# Patient Record
Sex: Male | Born: 1965 | Race: White | Hispanic: No | Smoking: Current every day smoker
Health system: Southern US, Community
[De-identification: ages and names within clinical notes are randomized; demographics above are authoritative.]

## PROBLEM LIST (undated history)

## (undated) DIAGNOSIS — S2220XA Unspecified fracture of sternum, initial encounter for closed fracture: Secondary | ICD-10-CM

## (undated) DIAGNOSIS — K047 Periapical abscess without sinus: Secondary | ICD-10-CM

## (undated) HISTORY — PX: HERNIA REPAIR: SHX51

## (undated) HISTORY — PX: EYE SURGERY: SHX253

---

## 2000-05-31 ENCOUNTER — Emergency Department (HOSPITAL_COMMUNITY): Admission: EM | Admit: 2000-05-31 | Discharge: 2000-05-31 | Payer: Self-pay | Admitting: Emergency Medicine

## 2000-05-31 ENCOUNTER — Encounter: Payer: Self-pay | Admitting: Emergency Medicine

## 2000-06-01 ENCOUNTER — Other Ambulatory Visit (HOSPITAL_COMMUNITY): Admission: RE | Admit: 2000-06-01 | Discharge: 2000-06-08 | Payer: Self-pay | Admitting: Psychiatry

## 2005-07-30 ENCOUNTER — Emergency Department (HOSPITAL_COMMUNITY): Admission: EM | Admit: 2005-07-30 | Discharge: 2005-07-30 | Payer: Self-pay | Admitting: Emergency Medicine

## 2005-07-31 ENCOUNTER — Ambulatory Visit: Payer: Self-pay | Admitting: Nurse Practitioner

## 2005-10-18 ENCOUNTER — Emergency Department (HOSPITAL_COMMUNITY): Admission: EM | Admit: 2005-10-18 | Discharge: 2005-10-18 | Payer: Self-pay | Admitting: Emergency Medicine

## 2005-10-22 ENCOUNTER — Ambulatory Visit: Payer: Self-pay | Admitting: Internal Medicine

## 2005-11-17 ENCOUNTER — Ambulatory Visit: Payer: Self-pay | Admitting: Nurse Practitioner

## 2006-09-14 ENCOUNTER — Emergency Department (HOSPITAL_COMMUNITY): Admission: EM | Admit: 2006-09-14 | Discharge: 2006-09-14 | Payer: Self-pay | Admitting: *Deleted

## 2007-06-29 ENCOUNTER — Emergency Department (HOSPITAL_COMMUNITY): Admission: EM | Admit: 2007-06-29 | Discharge: 2007-06-29 | Payer: Self-pay | Admitting: Emergency Medicine

## 2011-01-24 LAB — DIFFERENTIAL
Basophils Absolute: 0.1
Eosinophils Absolute: 0.3
Lymphocytes Relative: 23
Lymphs Abs: 1.5
Neutro Abs: 4.4

## 2011-01-24 LAB — CBC
Hemoglobin: 14.6
MCV: 96.8
Platelets: 316
RBC: 4.3
RDW: 13.7
WBC: 6.7

## 2011-01-24 LAB — I-STAT 8, (EC8 V) (CONVERTED LAB)
Acid-base deficit: 1
Chloride: 107
Glucose, Bld: 102 — ABNORMAL HIGH
Hemoglobin: 16
Sodium: 141
pCO2, Ven: 39 — ABNORMAL LOW

## 2011-01-24 LAB — D-DIMER, QUANTITATIVE: D-Dimer, Quant: <0.22

## 2011-01-24 LAB — POCT CARDIAC MARKERS: Myoglobin, poc: 38.2

## 2011-08-21 ENCOUNTER — Other Ambulatory Visit: Payer: Self-pay

## 2011-08-21 ENCOUNTER — Encounter (HOSPITAL_COMMUNITY): Payer: Self-pay | Admitting: Emergency Medicine

## 2011-08-21 ENCOUNTER — Emergency Department (HOSPITAL_COMMUNITY)
Admission: EM | Admit: 2011-08-21 | Discharge: 2011-08-21 | Disposition: A | Discharge status: Home / Self Care | Payer: Self-pay | Attending: Emergency Medicine | Admitting: Emergency Medicine

## 2011-08-21 DIAGNOSIS — F17200 Nicotine dependence, unspecified, uncomplicated: Secondary | ICD-10-CM | POA: Insufficient documentation

## 2011-08-21 DIAGNOSIS — R109 Unspecified abdominal pain: Secondary | ICD-10-CM | POA: Insufficient documentation

## 2011-08-21 LAB — CBC
HCT: 43 % (ref 39.0–52.0)
Hemoglobin: 14.9 g/dL (ref 13.0–17.0)
MCH: 32.6 pg (ref 26.0–34.0)
MCHC: 34.7 g/dL (ref 30.0–36.0)
MCV: 94.1 fL (ref 78.0–100.0)
Platelets: 347 10*3/uL (ref 150–400)
RBC: 4.57 MIL/uL (ref 4.22–5.81)
RDW: 12.6 % (ref 11.5–15.5)
WBC: 9 10*3/uL (ref 4.0–10.5)

## 2011-08-21 LAB — COMPREHENSIVE METABOLIC PANEL
ALT: 13 U/L (ref 0–53)
AST: 14 U/L (ref 0–37)
Albumin: 3.8 g/dL (ref 3.5–5.2)
Alkaline Phosphatase: 66 U/L (ref 39–117)
BUN: 7 mg/dL (ref 6–23)
CO2: 29 mEq/L (ref 19–32)
Calcium: 9.4 mg/dL (ref 8.4–10.5)
Chloride: 102 mEq/L (ref 96–112)
Creatinine, Ser: 0.71 mg/dL (ref 0.50–1.35)
GFR calc Af Amer: >90 mL/min (ref >90)
GFR calc non Af Amer: >90 mL/min (ref >90)
Glucose, Bld: 104 mg/dL — ABNORMAL HIGH (ref 70–99)
Potassium: 3.9 mEq/L (ref 3.5–5.1)
Sodium: 140 mEq/L (ref 135–145)
Total Bilirubin: 0.3 mg/dL (ref 0.3–1.2)
Total Protein: 6.9 g/dL (ref 6.0–8.3)

## 2011-08-21 LAB — URINALYSIS, ROUTINE W REFLEX MICROSCOPIC
Bilirubin Urine: NEGATIVE
Glucose, UA: NEGATIVE mg/dL
Hgb urine dipstick: NEGATIVE
Ketones, ur: NEGATIVE mg/dL
Leukocytes, UA: NEGATIVE
Nitrite: NEGATIVE
Protein, ur: NEGATIVE mg/dL
Specific Gravity, Urine: 1.01 (ref 1.005–1.030)
Urobilinogen, UA: 0.2 mg/dL (ref 0.0–1.0)
pH: 6.5 (ref 5.0–8.0)

## 2011-08-21 LAB — LIPASE, BLOOD: Lipase: 18 U/L (ref 11–59)

## 2011-08-21 MED ORDER — MORPHINE SULFATE 4 MG/ML IJ SOLN
4.0000 mg | Freq: Once | INTRAMUSCULAR | Status: AC
  Administered 2011-08-21: 4 mg via INTRAVENOUS
  Filled 2011-08-21: qty 1

## 2011-08-21 MED ORDER — GI COCKTAIL ~~LOC~~
10.0000 mL | Freq: Once | ORAL | Status: AC
  Administered 2011-08-21: 10 mL via ORAL
  Filled 2011-08-21: qty 30

## 2011-08-21 MED ORDER — SODIUM CHLORIDE 0.9 % IV BOLUS (SEPSIS)
1000.0000 mL | Freq: Once | INTRAVENOUS | Status: AC
  Administered 2011-08-21: 1000 mL via INTRAVENOUS

## 2011-08-21 MED ORDER — FAMOTIDINE IN NACL 20-0.9 MG/50ML-% IV SOLN
20.0000 mg | Freq: Once | INTRAVENOUS | Status: AC
  Administered 2011-08-21 (×2): 20 mg via INTRAVENOUS
  Filled 2011-08-21: qty 50

## 2011-08-21 MED ORDER — FAMOTIDINE 20 MG PO TABS: 20.0000 mg | ORAL_TABLET | Freq: Two times a day (BID) | ORAL | Status: DC

## 2011-08-21 NOTE — Discharge Instructions (Signed)
Abdominal Pain Abdominal pain can be caused by many things. Your caregiver decides the seriousness of your pain by an examination and possibly blood tests and X-rays. Many cases can be observed and treated at home. Most abdominal pain is not caused by a disease and will probably improve without treatment. However, in many cases, more time must pass before a clear cause of the pain can be found. Before that point, it may not be known if you need more testing, or if hospitalization or surgery is needed. HOME CARE INSTRUCTIONS   Do not take laxatives unless directed by your caregiver.   Take pain medicine only as directed by your caregiver.   Only take over-the-counter or prescription medicines for pain, discomfort, or fever as directed by your caregiver.   Try a clear liquid diet (broth, tea, or water) for as long as directed by your caregiver. Slowly move to a bland diet as tolerated.  SEEK IMMEDIATE MEDICAL CARE IF:   The pain does not go away.   You have a fever.   You keep throwing up (vomiting).   The pain is felt only in portions of the abdomen. Pain in the right side could possibly be appendicitis. In an adult, pain in the left lower portion of the abdomen could be colitis or diverticulitis.   You pass bloody or black tarry stools.  MAKE SURE YOU:   Understand these instructions.   Will watch your condition.   Will get help right away if you are not doing well or get worse.  Document Released: 01/29/2005 Document Revised: 04/10/2011 Document Reviewed: 12/08/2007 ExitCare Patient Information 2012 [employer]  RESOURCE GUIDE  Dental Problems  Patients with Medicaid: [facility]                     [facility] [address]                                           [address] Phone:  [phone]                                                  Phone:  [phone]  If unable to pay or uninsured, contact:  [facility] or [place] [facility] to become qualified for the adult dental clinic.  Chronic Pain Problems Contact [facility]  [phone] Patients need to be referred by their primary care doctor.  Insufficient Money for Medicine Contact [employer]:  call "211" or [facility] 271-5999.  No Primary Care Doctor Call [facility]  832-8000 Other agencies that provide inexpensive medical care    [facility]  [phone]    [facility]  [phone]    [facility]  [phone]    [facility] Clinic  [phone]    [facility]  [phone]    [facility]  [phone]  [facility]  [phone] [facility]  [phone] [facility]   800 853-5163 (emergency services 641-4993)  Substance Abuse Resources [facility]  336-882-2125 [facility] 336-784-9470 [facility] 336-285-9073 [facility] 336-845-3988 Residential & Outpatient Substance Abuse Program  800-659-3381    Abuse/Neglect [place] Child Abuse Hotline (336) 641-3795 [place] Child Abuse Hotline 800-378-5315 (After Hours)  Emergency Shelter [facility] (336) 271-5985  [facility] (336) 275-9566 [facility] (704) 372-4663  MRSA Hotline #:   832-7006    [place] [facility]  [facility] [place]     [employer]                          [facility] [address] [place]                       [address]      [address]                                                  [place]                            [place] Phone:  [phone]                                   Phone:  [phone]                 Phone:  [phone]  [facility] Phone:  [phone]  [place] Child Abuse Hotline (336) 342-1394 (336) 342-3537 (After  Hours)   

## 2011-08-21 NOTE — Progress Notes (Signed)
Met with patient to discuss his health care practices. He states that he just moved to [place] in February (from [place]) and is living with his brother and sister-in-law. He does not work, he does not have a physician, and he does not have health insurance (he also does not drive). Patient agreed to meet with the community liaison for [facility]) and is interested in the orange card program.

## 2011-08-21 NOTE — ED Provider Notes (Signed)
History    45yM with abdominal pain. Gradual onset about 2w ago. Epigastric without radiation. Intermittent but having more frequent recently achy to burning. No appreciable exacerbating or relieving factors. Drinks fairly regularly./ denies hx of pud or pancreatitis. Nausea. No v/d. No fever or chills. No cp or sob.  CSN: [id number]  Arrival date & time 08/21/11  2952   First MD Initiated Contact with Patient 08/21/11 0757      Chief Complaint  Patient presents with  . Abdominal Pain    (Consider location/radiation/quality/duration/timing/severity/associated sxs/prior treatment) HPI  History reviewed. No pertinent past medical history.  History reviewed. No pertinent past surgical history.  History reviewed. No pertinent family history.  History  Substance Use Topics  . Smoking status: Current Everyday Smoker -- 1.0 packs/day    Types: Cigarettes  . Smokeless tobacco: Not on file  . Alcohol Use: 0.6 oz/week    1 Cans of beer per week      Review of Systems   Review of symptoms negative unless otherwise noted in HPI.   Allergies  Review of patient's allergies indicates no known allergies.  Home Medications   Current Outpatient Rx  Name Route Sig Dispense Refill  . CALCIUM CARBONATE ANTACID 500 MG PO CHEW Oral Chew 1 tablet by mouth daily as needed. For indigestion.      BP 118/78  Pulse 80  Temp(Src) 98.1 F (36.7 C) (Oral)  Resp 17  SpO2 98%  Physical Exam  Nursing note and vitals reviewed. Constitutional: He appears well-developed and well-nourished. No distress.       Sitting up in bed. nad.  HENT:  Head: Normocephalic and atraumatic.  Eyes: Conjunctivae are normal. Pupils are equal, round, and reactive to light. Right eye exhibits no discharge. Left eye exhibits no discharge.  Neck: Normal range of motion. Neck supple.  Cardiovascular: Normal rate, regular rhythm and normal heart sounds.  Exam reveals no gallop and no friction rub.   No murmur  heard. Pulmonary/Chest: Effort normal and breath sounds normal. No respiratory distress.  Abdominal: Soft. He exhibits no distension and no mass. There is tenderness. There is no rebound and no guarding.       Mild to moderate tenderness in epigastrium without rebound or guarding.  Genitourinary:       No cva tenderness  Musculoskeletal: He exhibits no edema and no tenderness.  Lymphadenopathy:    He has no cervical adenopathy.  Neurological: He is alert.  Skin: Skin is warm and dry. He is not diaphoretic.  Psychiatric: He has a normal mood and affect. His behavior is normal. Thought content normal.    ED Course  Procedures (including critical care time)  Labs Reviewed  COMPREHENSIVE METABOLIC PANEL - Abnormal; Notable for the following:    Glucose, Bld 104 (*)    All other components within normal limits  LIPASE, BLOOD  CBC  URINALYSIS, ROUTINE W REFLEX MICROSCOPIC  LAB REPORT - SCANNED   No results found.  EKG:  Rhythm: nsr Rate: 94 Axis: normal Intervals: normal ST segments: normal   1. Abdominal pain       MDM  45 rolled male with epigastric pain. Possible peptic ulcer disease or gastritis given history of chronic alcohol use. Pain completely relieved with GI cocktail. Low clinical suspicion for surgical abdomen. Lipase is normal. Will start patient on an H2 blocker. Return precautions were discussed. Outpatient followup.        Raeford Razor, MD 08/25/11 [phone]

## 2011-08-21 NOTE — ED Notes (Signed)
Pt reports intermittent abd pain X 2 weeks. Pt reports nausea but denies any vomiting and diarrhea.

## 2011-08-21 NOTE — ED Notes (Signed)
MD at bedside. 

## 2011-12-15 ENCOUNTER — Encounter (HOSPITAL_COMMUNITY): Payer: Self-pay

## 2011-12-15 ENCOUNTER — Emergency Department (HOSPITAL_COMMUNITY): Payer: Self-pay

## 2011-12-15 ENCOUNTER — Emergency Department (HOSPITAL_COMMUNITY)
Admission: EM | Admit: 2011-12-15 | Discharge: 2011-12-15 | Disposition: A | Discharge status: Home / Self Care | Payer: Self-pay | Attending: Emergency Medicine | Admitting: Emergency Medicine

## 2011-12-15 DIAGNOSIS — F17200 Nicotine dependence, unspecified, uncomplicated: Secondary | ICD-10-CM | POA: Insufficient documentation

## 2011-12-15 DIAGNOSIS — R109 Unspecified abdominal pain: Secondary | ICD-10-CM | POA: Insufficient documentation

## 2011-12-15 LAB — CBC WITH DIFFERENTIAL/PLATELET
Eosinophils Absolute: 0.2 10*3/uL (ref 0.0–0.7)
Eosinophils Relative: 2 % (ref 0–5)
HCT: 45.1 % (ref 39.0–52.0)
Hemoglobin: 15.5 g/dL (ref 13.0–17.0)
Lymphs Abs: 1.2 10*3/uL (ref 0.7–4.0)
MCH: 32 pg (ref 26.0–34.0)
MCV: 93.2 fL (ref 78.0–100.0)
Monocytes Relative: 7 % (ref 3–12)
RBC: 4.84 MIL/uL (ref 4.22–5.81)

## 2011-12-15 LAB — COMPREHENSIVE METABOLIC PANEL
BUN: 7 mg/dL (ref 6–23)
CO2: 27 mEq/L (ref 19–32)
Calcium: 9.2 mg/dL (ref 8.4–10.5)
GFR calc Af Amer: >90 mL/min (ref >90)
Sodium: 136 mEq/L (ref 135–145)

## 2011-12-15 MED ORDER — IOHEXOL 300 MG/ML  SOLN
100.0000 mL | Freq: Once | INTRAMUSCULAR | Status: AC | PRN
  Administered 2011-12-15: 100 mL via INTRAVENOUS

## 2011-12-15 MED ORDER — SODIUM CHLORIDE 0.9 % IV SOLN
INTRAVENOUS | Status: DC
  Administered 2011-12-15 (×2): via INTRAVENOUS

## 2011-12-15 MED ORDER — IOHEXOL 300 MG/ML  SOLN
20.0000 mL | INTRAMUSCULAR | Status: AC
  Administered 2011-12-15 (×2): 20 mL via ORAL

## 2011-12-15 MED ORDER — MORPHINE SULFATE 4 MG/ML IJ SOLN
6.0000 mg | Freq: Once | INTRAMUSCULAR | Status: AC
  Administered 2011-12-15: 6 mg via INTRAVENOUS
  Filled 2011-12-15 (×2): qty 1

## 2011-12-15 MED ORDER — MORPHINE SULFATE 4 MG/ML IJ SOLN
4.0000 mg | Freq: Once | INTRAMUSCULAR | Status: AC
  Administered 2011-12-15: 4 mg via INTRAVENOUS
  Filled 2011-12-15: qty 1

## 2011-12-15 MED ORDER — METOCLOPRAMIDE HCL 10 MG PO TABS: 10.0000 mg | ORAL_TABLET | Freq: Four times a day (QID) | ORAL | Status: DC

## 2011-12-15 MED ORDER — OXYCODONE-ACETAMINOPHEN 5-325 MG PO TABS: 1.0000 | ORAL_TABLET | ORAL | Status: AC | PRN

## 2011-12-15 NOTE — ED Provider Notes (Signed)
Re-evaluation, 16:15: Patient continues to have pain. Additional pain medications ordered. Abdomen is soft. Tender in the epigastric, periumbilical region. CT scan completely negative, lab studies normal. Patient can be discharged home and should follow up with PCP as needed for persistent pain.  Rodena Medin, PA-C 12/15/11 1623

## 2011-12-15 NOTE — ED Notes (Signed)
Po contrast complete.  Megan called in CT.

## 2011-12-15 NOTE — ED Provider Notes (Addendum)
History     CSN: [id number]  Arrival date & time 12/15/11  1119   First MD Initiated Contact with Patient 12/15/11 1239      Chief Complaint  Patient presents with  . Abdominal Pain    (Consider location/radiation/quality/duration/timing/severity/associated sxs/prior treatment) HPI Complaint of diffuse abdominal pain onset approximately 3 AM today. Nothing makes symptoms better or worse pain is constant, severe. No treatment prior to coming here. Last bowel movement yesterday, normal last ate last night. No fever no nausea or vomiting no other associated symptoms No past medical history on file. Past medical history GERD No past surgical history on file. Surgical history Hernia repair No family history on file.  History  Substance Use Topics  . Smoking status: Current Everyday Smoker -- 1.0 packs/day    Types: Cigarettes  . Smokeless tobacco: Not on file  . Alcohol Use: No   Former user of alcohol none in several weeks   Review of Systems  Constitutional: Negative.  Negative for diaphoresis.  HENT: Negative.   Respiratory: Negative.   Cardiovascular: Negative.   Gastrointestinal: Positive for abdominal pain.  Musculoskeletal: Negative.   Skin: Negative.   Neurological: Negative.   Hematological: Negative.   Psychiatric/Behavioral: Negative.   All other systems reviewed and are negative.    Allergies  Review of patient's allergies indicates no known allergies.  Home Medications   Current Outpatient Rx  Name Route Sig Dispense Refill  . CALCIUM CARBONATE ANTACID 500 MG PO CHEW Oral Chew 1 tablet by mouth daily as needed. For indigestion.    Marland Kitchen FAMOTIDINE 20 MG PO TABS Oral Take 1 tablet (20 mg total) by mouth 2 (two) times daily. 60 tablet 0    BP 156/101  Pulse 95  Temp 97.6 F (36.4 C) (Oral)  Ht 6' (1.829 m)  Wt 145 lb (65.772 kg)  BMI 19.67 kg/m2  SpO2 96%  Physical Exam  Nursing note and vitals reviewed. Constitutional: He appears well-developed  and well-nourished.  HENT:  Head: Normocephalic and atraumatic.       Poor dentition  Eyes: Conjunctivae are normal. Pupils are equal, round, and reactive to light.  Neck: Neck supple. No tracheal deviation present. No thyromegaly present.  Cardiovascular: Normal rate and regular rhythm.   No murmur heard. Pulmonary/Chest: Effort normal and breath sounds normal.  Abdominal: Soft. Bowel sounds are normal. He exhibits no distension and no mass. There is tenderness. There is no guarding.       Diffusely tender  Genitourinary:       Normal male genitalia  Musculoskeletal: Normal range of motion. He exhibits no edema and no tenderness.  Neurological: He is alert. Coordination normal.  Skin: Skin is warm and dry. No rash noted.  Psychiatric: He has a normal mood and affect.    ED Course  Procedures (including critical care time)   Labs Reviewed  COMPREHENSIVE METABOLIC PANEL  CBC WITH DIFFERENTIAL  LIPASE, BLOOD   No results found.   No diagnosis found.    MDM  Pain nonspecific presently. To obtain lab work CT scan., Serial exams. Recheck vital signs Move to CDU Diagnosis abdominal pain        Doug Sou, MD 12/15/11 1311  Doug Sou, MD 12/15/11 1312

## 2011-12-15 NOTE — ED Notes (Signed)
Pt experienced this pain a few years ago and was told he has a hernia and did not follow up with a dr because he doesn't a job.

## 2011-12-15 NOTE — ED Provider Notes (Signed)
Medical screening examination/treatment/procedure(s) were conducted as a shared visit with non-physician practitioner(s) and myself.  I personally evaluated the patient during the encounter  Doug Sou, MD 12/15/11 1637

## 2011-12-16 ENCOUNTER — Encounter (HOSPITAL_COMMUNITY): Payer: Self-pay | Admitting: Emergency Medicine

## 2011-12-16 ENCOUNTER — Emergency Department (HOSPITAL_COMMUNITY): Payer: Self-pay

## 2011-12-16 ENCOUNTER — Emergency Department (HOSPITAL_COMMUNITY)
Admission: EM | Admit: 2011-12-16 | Discharge: 2011-12-16 | Disposition: A | Discharge status: Home / Self Care | Payer: Self-pay | Attending: Emergency Medicine | Admitting: Emergency Medicine

## 2011-12-16 DIAGNOSIS — K2970 Gastritis, unspecified, without bleeding: Secondary | ICD-10-CM | POA: Insufficient documentation

## 2011-12-16 DIAGNOSIS — R52 Pain, unspecified: Secondary | ICD-10-CM | POA: Insufficient documentation

## 2011-12-16 DIAGNOSIS — R109 Unspecified abdominal pain: Secondary | ICD-10-CM | POA: Insufficient documentation

## 2011-12-16 LAB — COMPREHENSIVE METABOLIC PANEL
AST: 11 U/L (ref 0–37)
BUN: 5 mg/dL — ABNORMAL LOW (ref 6–23)
CO2: 25 mEq/L (ref 19–32)
Calcium: 9.2 mg/dL (ref 8.4–10.5)
Creatinine, Ser: 0.73 mg/dL (ref 0.50–1.35)
GFR calc Af Amer: >90 mL/min (ref >90)
GFR calc non Af Amer: >90 mL/min (ref >90)

## 2011-12-16 LAB — CBC WITH DIFFERENTIAL/PLATELET
Basophils Absolute: 0.1 10*3/uL (ref 0.0–0.1)
Eosinophils Relative: 3 % (ref 0–5)
HCT: 44 % (ref 39.0–52.0)
Lymphocytes Relative: 11 % — ABNORMAL LOW (ref 12–46)
MCHC: 34.5 g/dL (ref 30.0–36.0)
MCV: 92.1 fL (ref 78.0–100.0)
Monocytes Absolute: 0.7 10*3/uL (ref 0.1–1.0)
RDW: 13.8 % (ref 11.5–15.5)
WBC: 9.4 10*3/uL (ref 4.0–10.5)

## 2011-12-16 LAB — URINALYSIS, ROUTINE W REFLEX MICROSCOPIC
Leukocytes, UA: NEGATIVE
Nitrite: NEGATIVE
Protein, ur: NEGATIVE mg/dL
Urobilinogen, UA: 0.2 mg/dL (ref 0.0–1.0)

## 2011-12-16 LAB — LIPASE, BLOOD: Lipase: 16 U/L (ref 11–59)

## 2011-12-16 MED ORDER — LANSOPRAZOLE 30 MG PO CPDR: 30.0000 mg | DELAYED_RELEASE_CAPSULE | Freq: Every day | ORAL | Status: DC

## 2011-12-16 MED ORDER — ONDANSETRON HCL 4 MG PO TABS: 4.0000 mg | ORAL_TABLET | Freq: Four times a day (QID) | ORAL | Status: AC

## 2011-12-16 MED ORDER — SODIUM CHLORIDE 0.9 % IV SOLN
80.0000 mg | Freq: Once | INTRAVENOUS | Status: AC
  Administered 2011-12-16: 80 mg via INTRAVENOUS
  Filled 2011-12-16: qty 80

## 2011-12-16 MED ORDER — SODIUM CHLORIDE 0.9 % IV BOLUS (SEPSIS)
1000.0000 mL | Freq: Once | INTRAVENOUS | Status: AC
  Administered 2011-12-16: 1000 mL via INTRAVENOUS

## 2011-12-16 MED ORDER — ONDANSETRON HCL 4 MG/2ML IJ SOLN
4.0000 mg | Freq: Once | INTRAMUSCULAR | Status: AC
  Administered 2011-12-16: 4 mg via INTRAVENOUS
  Filled 2011-12-16: qty 2

## 2011-12-16 MED ORDER — SUCRALFATE 1 G PO TABS: 1.0000 g | ORAL_TABLET | Freq: Four times a day (QID) | ORAL | Status: DC

## 2011-12-16 MED ORDER — GI COCKTAIL ~~LOC~~
30.0000 mL | Freq: Once | ORAL | Status: AC
  Administered 2011-12-16: 30 mL via ORAL
  Filled 2011-12-16: qty 30

## 2011-12-16 NOTE — ED Notes (Signed)
Pt reports having 2-3 bowel movements a day

## 2011-12-16 NOTE — ED Notes (Signed)
Pt reports it taking a long time to start urination. Pt denies dysuria, hematuria

## 2011-12-16 NOTE — ED Provider Notes (Signed)
History     CSN: [id number]  Arrival date & time 12/16/11  0018   First MD Initiated Contact with Patient 12/16/11 0151      Chief Complaint  Patient presents with  . Abdominal Pain  . Chills    (Consider location/radiation/quality/duration/timing/severity/associated sxs/prior treatment) HPI 46 year old male presents emergency department complaining of persistent abdominal pain. Patient was seen yesterday at [facility] after onset of pain at 3 AM waking him. Patient had lab work and CT scan without specific cause of his of bowel pain. He was prescribed Percocet and Reglan. Patient reports no improvement in his pain with these medications. He has had subjective chills and fever. He's had no fevers. Patient reports similar symptoms in the past and started on antiacid medicine. He is not taking any antacids for this pain. Patient is a smoker, reports he occasionally has alcohol. He denies any previous history of gallstones or pancreatitis. No history of inguinal hernia repair. Patient has had nausea but no vomiting.   History reviewed. No pertinent past medical history.  Past Surgical History  Procedure Date  . Hernia repair     Family History  Problem Relation Age of Onset  . Cancer Father     History  Substance Use Topics  . Smoking status: Current Everyday Smoker -- 1.0 packs/day    Types: Cigarettes  . Smokeless tobacco: Not on file  . Alcohol Use: 0.0 oz/week      Review of Systems  All other systems reviewed and are negative.    Allergies  Review of patient's allergies indicates no known allergies.  Home Medications   Current Outpatient Rx  Name Route Sig Dispense Refill  . CALCIUM CARBONATE ANTACID 500 MG PO CHEW Oral Chew 1 tablet by mouth daily as needed. For indigestion.    Marland Kitchen METOCLOPRAMIDE HCL 10 MG PO TABS Oral Take 1 tablet (10 mg total) by mouth every 6 (six) hours. 12 tablet 0  . OXYCODONE-ACETAMINOPHEN 5-325 MG PO TABS Oral Take 1 tablet by mouth  every 4 (four) hours as needed for pain. 10 tablet 0  . LANSOPRAZOLE 30 MG PO CPDR Oral Take 1 capsule (30 mg total) by mouth daily. 60 capsule 0  . ONDANSETRON HCL 4 MG PO TABS Oral Take 1 tablet (4 mg total) by mouth every 6 (six) hours. PRN nausea 12 tablet 0  . SUCRALFATE 1 G PO TABS Oral Take 1 tablet (1 g total) by mouth 4 (four) times daily. 30 tablet 0    BP 113/83  Pulse 64  Temp 98.2 F (36.8 C) (Oral)  Resp 20  SpO2 96%  Physical Exam  Nursing note and vitals reviewed. Constitutional: He is oriented to person, place, and time. He appears well-developed and well-nourished.  HENT:  Head: Normocephalic and atraumatic.  Nose: Nose normal.  Mouth/Throat: Oropharynx is clear and moist.       Dry mucous membranes, poor dentition  Eyes: Conjunctivae and EOM are normal. Pupils are equal, round, and reactive to light.  Neck: Normal range of motion. Neck supple. No JVD present. No tracheal deviation present. No thyromegaly present.  Cardiovascular: Normal rate, regular rhythm, normal heart sounds and intact distal pulses.  Exam reveals no gallop and no friction rub.   No murmur heard. Pulmonary/Chest: Effort normal and breath sounds normal. No stridor. No respiratory distress. He has no wheezes. He has no rales. He exhibits no tenderness.  Abdominal: Soft. Bowel sounds are normal. He exhibits no distension and no mass.  There is tenderness (epigastric tenderness). There is no rebound and no guarding.  Musculoskeletal: Normal range of motion. He exhibits no edema and no tenderness.  Lymphadenopathy:    He has no cervical adenopathy.  Neurological: He is alert and oriented to person, place, and time. He exhibits normal muscle tone. Coordination normal.  Skin: Skin is warm and dry. No rash noted. No erythema. No pallor.  Psychiatric: He has a normal mood and affect. His behavior is normal. Judgment and thought content normal.    ED Course  Procedures (including critical care  time)  Labs Reviewed  CBC WITH DIFFERENTIAL - Abnormal; Notable for the following:    Neutrophils Relative 78 (*)     Lymphocytes Relative 11 (*)     All other components within normal limits  COMPREHENSIVE METABOLIC PANEL - Abnormal; Notable for the following:    Sodium 133 (*)     Glucose, Bld 111 (*)     BUN 5 (*)     All other components within normal limits  LIPASE, BLOOD  URINALYSIS, ROUTINE W REFLEX MICROSCOPIC   US Abdomen Complete  12/16/2011  *RADIOLOGY REPORT*  Clinical Data:  Abdominal pain.  Gallstones.  COMPLETE ABDOMINAL ULTRASOUND  Comparison:  CT 12/15/2011.  Findings:  Gallbladder:  Biliary sludge is present.  No wall thickening or pericholecystic fluid.  There is no sonographic Murphy's sign.  Common bile duct:  5 mm when measured perpendicular to the lumen, normal for age.  Liver:  No focal lesion identified.  Within normal limits in parenchymal echogenicity.  IVC:  Appears normal.  Pancreas:  Suboptimally visualized due to overlying bowel gas.  Spleen:  84 mm.  Normal echotexture.  Right Kidney:  11.3 cm.  Normal echotexture.  Left Kidney:  10.9 cm.  Normal echotexture.  Abdominal aorta:  Incomplete visualization.  No aneurysm.  IMPRESSION: Biliary sludge.  No findings of acute cholecystitis.  Original Report Authenticated By: Andreas Newport, M.D.   Ct Abdomen Pelvis W Contrast  12/15/2011  *RADIOLOGY REPORT*  Clinical Data: Mid abdominal pain.  CT ABDOMEN AND PELVIS WITH CONTRAST  Technique:  Multidetector CT imaging of the abdomen and pelvis was performed following the standard protocol during bolus administration of intravenous contrast.  Contrast: OMNIPAQUE IOHEXOL 300 MG/ML  SOLN  Comparison: None.  Findings: 4 mm low density lesion in the posterior right liver is likely a cyst.  The spleen is unremarkable.  The stomach, duodenum, pancreas, gallbladder, adrenal glands, and right kidney are unremarkable. 9 mm well-defined low density lesion in the lower pole of the  left kidney is compatible with a cyst.  No abdominal aortic aneurysm.  No free fluid or lymphadenopathy in the abdomen.  The abdominal bowel loops are unremarkable.  Imaging through the pelvis shows no free intraperitoneal fluid.  No pelvic sidewall lymphadenopathy.  Bladder is unremarkable.  The terminal ileum is normal.  The appendix is normal.  Bone windows reveal no worrisome lytic or sclerotic osseous lesions.  IMPRESSION: No acute findings in the abdomen or pelvis.  Specifically, no CT evidence to explain the patient's history of pain.  Original Report Authenticated By: ERIC A. MANSELL, M.D.     1. Abdominal pain, acute   2. Gastritis       MDM  46 year-old male with recent ED evaluation for abdominal pain. Labs again today are normal. Ultrasound obtained for potential cholelithiasis as cause for pain. Patient better after GI cocktail. Will send home with Carafate and Prevacid. Patient given followup  instructions.        Olivia Mackie, MD 12/17/11 [id number]

## 2011-12-16 NOTE — ED Notes (Signed)
Pt presents with c/o abd pain that started about 0300 on Monday morning  Pt was seen at [facility] on Monday and sent home with medication that pt states is not helping  Pt states since then he has developed chills and fever  Pt was given reglan and percocet in which he has been taking without any relief

## 2013-01-23 ENCOUNTER — Encounter (HOSPITAL_COMMUNITY): Payer: Self-pay | Admitting: Emergency Medicine

## 2013-01-23 ENCOUNTER — Emergency Department (HOSPITAL_COMMUNITY)
Admission: EM | Admit: 2013-01-23 | Discharge: 2013-01-24 | Disposition: A | Discharge status: Home / Self Care | Payer: Self-pay | Attending: Emergency Medicine | Admitting: Emergency Medicine

## 2013-01-23 DIAGNOSIS — Z792 Long term (current) use of antibiotics: Secondary | ICD-10-CM | POA: Insufficient documentation

## 2013-01-23 DIAGNOSIS — F17200 Nicotine dependence, unspecified, uncomplicated: Secondary | ICD-10-CM | POA: Insufficient documentation

## 2013-01-23 DIAGNOSIS — R51 Headache: Secondary | ICD-10-CM | POA: Insufficient documentation

## 2013-01-23 DIAGNOSIS — R000 Tachycardia, unspecified: Secondary | ICD-10-CM | POA: Insufficient documentation

## 2013-01-23 DIAGNOSIS — R1310 Dysphagia, unspecified: Secondary | ICD-10-CM | POA: Insufficient documentation

## 2013-01-23 DIAGNOSIS — Z79899 Other long term (current) drug therapy: Secondary | ICD-10-CM | POA: Insufficient documentation

## 2013-01-23 DIAGNOSIS — K1120 Sialoadenitis, unspecified: Secondary | ICD-10-CM | POA: Insufficient documentation

## 2013-01-23 DIAGNOSIS — H9209 Otalgia, unspecified ear: Secondary | ICD-10-CM | POA: Insufficient documentation

## 2013-01-23 LAB — CBC WITH DIFFERENTIAL/PLATELET
Basophils Absolute: 0.1 10*3/uL (ref 0.0–0.1)
Basophils Relative: 0 % (ref 0–1)
Lymphocytes Relative: 8 % — ABNORMAL LOW (ref 12–46)
Neutro Abs: 12.7 10*3/uL — ABNORMAL HIGH (ref 1.7–7.7)
Platelets: 298 10*3/uL (ref 150–400)
RDW: 13.4 % (ref 11.5–15.5)
WBC: 15.7 10*3/uL — ABNORMAL HIGH (ref 4.0–10.5)

## 2013-01-23 LAB — POCT I-STAT, CHEM 8
BUN: 11 mg/dL (ref 6–23)
Chloride: 102 mEq/L (ref 96–112)
HCT: 49 % (ref 39.0–52.0)
Sodium: 140 mEq/L (ref 135–145)
TCO2: 25 mmol/L (ref 0–100)

## 2013-01-23 NOTE — ED Notes (Addendum)
C/o pain and swelling to L side of neck since earlier today. Reports difficulty swallowing.

## 2013-01-23 NOTE — ED Provider Notes (Signed)
CSN: [id number]     Arrival date & time 01/23/13  2151 History   First MD Initiated Contact with Patient 01/23/13 2325     Chief Complaint  Patient presents with  . Neck Pain   (Consider location/radiation/quality/duration/timing/severity/associated sxs/prior Treatment) HPI Comments: Patient states he woke this morning with some left-sided neck discomfort.  This progressively gotten worse throughout the day.  Painful swallowing, painful to palpation, painful with certain movements of his neck.  Denies any toothache, recent URI symptoms, fever.  This morning.  He took 2 Advil without any relief  Patient is a 47 y.o. male presenting with neck pain. The history is provided by the patient.  Neck Pain Pain location:  L side Quality:  Aching Pain severity:  Moderate Onset quality:  Gradual Duration:  1 day Timing:  Constant Progression:  Worsening Chronicity:  New Relieved by:  Nothing Worsened by:  Position and swallowing Associated symptoms: headaches   Associated symptoms: no fever     History reviewed. No pertinent past medical history. Past Surgical History  Procedure Laterality Date  . Hernia repair    . Eye surgery     Family History  Problem Relation Age of Onset  . Cancer Father    History  Substance Use Topics  . Smoking status: Current Every Day Smoker -- 1.00 packs/day    Types: Cigarettes  . Smokeless tobacco: Not on file  . Alcohol Use: 0.0 oz/week    Review of Systems  Constitutional: Negative for fever.  HENT: Positive for ear pain, trouble swallowing and neck pain. Negative for sore throat, mouth sores, neck stiffness, dental problem and postnasal drip.   Neurological: Positive for headaches. Negative for dizziness.  All other systems reviewed and are negative.    Allergies  Review of patient's allergies indicates no known allergies.  Home Medications   Current Outpatient Rx  Name  Route  Sig  Dispense  Refill  . Cimetidine (ACID REDUCER PO)  Oral   Take 1 tablet by mouth daily.         . naproxen sodium (ANAPROX) 220 MG tablet   Oral   Take 440 mg by mouth daily.         . cephALEXin (KEFLEX) 250 MG capsule   Oral   Take 1 capsule (250 mg total) by mouth 4 (four) times daily.   28 capsule   0   . oxyCODONE-acetaminophen (PERCOCET/ROXICET) 5-325 MG per tablet   Oral   Take 1 tablet by mouth every 6 (six) hours as needed for pain.   12 tablet   0    BP 155/95  Pulse 120  Temp(Src) 98.7 F (37.1 C) (Oral)  Resp 18  SpO2 98% Physical Exam  Nursing note and vitals reviewed. Constitutional: He appears well-developed and well-nourished.  HENT:  Head: Normocephalic.  Right Ear: External ear normal.  Left Ear: External ear normal.  Mouth/Throat: Oropharynx is clear and moist. No oropharyngeal exudate.  Extensive periodontal disease  Eyes: Pupils are equal, round, and reactive to light.  Neck: No spinous process tenderness and no muscular tenderness present. No rigidity. No tracheal deviation, no edema and no erythema present. No thyromegaly present.    Cardiovascular: Regular rhythm.  Tachycardia present.   Pulmonary/Chest: Effort normal.  Abdominal: Soft.  Musculoskeletal: Normal range of motion.  Neurological: He is alert. No cranial nerve deficit.  Skin: Skin is warm. No rash noted. No erythema.    ED Course  Procedures (including critical care time) Labs Review  Labs Reviewed  CBC WITH DIFFERENTIAL - Abnormal; Notable for the following:    WBC 15.7 (*)    MCHC 36.3 (*)    Neutrophils Relative % 81 (*)    Neutro Abs 12.7 (*)    Lymphocytes Relative 8 (*)    Monocytes Absolute 1.4 (*)    All other components within normal limits  AMYLASE - Abnormal; Notable for the following:    Amylase 981 (*)    All other components within normal limits  POCT I-STAT, CHEM 8 - Abnormal; Notable for the following:    Calcium, Ion 1.11 (*)    All other components within normal limits   Imaging Review Ct Soft  Tissue Neck W Contrast  01/24/2013   CLINICAL DATA:  Neck pain.  EXAM: CT NECK WITH CONTRAST  TECHNIQUE: Multidetector CT imaging of the neck was performed using the standard protocol following the bolus administration of intravenous contrast.  CONTRAST:  80mL OMNIPAQUE IOHEXOL 300 MG/ML  SOLN  COMPARISON:  02/11/2008.  FINDINGS: There is mainly subcutaneous low-attenuation in the left submandibular space, extending from the tail of the parotid to the level of the larynx. The source is not certain, although there may be mild enlargement and edematous change within the tail of the parotid. No sialolithiasis seen. No lymphadenopathy or collection. No deep tissue infection, including the floor of the mouth.  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 No evidence of mass along the surfaces of the aerodigestive tract. Other than the lower left parotid as above, the salivary and thyroid glands are unremarkable. No significant osseous findings.  Numerous dental caries.  Centrilobular and paraseptal emphysema.  IMPRESSION: Nonspecific superficial left neck edema which could be infectious, inflammatory, or posttraumatic. The source is not clearly identified - question mild left superficial parotitis. No abscess.   Electronically Signed   By: Tiburcio Pea   On: 01/24/2013 01:22    MDM   1. Parotiditis     I discussed the diagnosis with patient he will be started on Keflex and he can afford, also prescription for Percocet for pain control and instructed to suck on and is treatment machine at work he's been referred to get Dr. Emeline Darling ENT for further evaluation    Arman Filter, NP 01/24/13 1610

## 2013-01-24 ENCOUNTER — Emergency Department (HOSPITAL_COMMUNITY): Payer: Self-pay

## 2013-01-24 ENCOUNTER — Encounter (HOSPITAL_COMMUNITY): Payer: Self-pay | Admitting: Radiology

## 2013-01-24 LAB — AMYLASE: Amylase: 981 U/L — ABNORMAL HIGH (ref 0–105)

## 2013-01-24 IMAGING — CT CT NECK W/ CM
3 of 4 series · 16 of 33 positions shown, 19 images · IV contrast (CONTRAST)
Comparison: [DATE].

CLINICAL DATA: Neck pain.

EXAM:
CT NECK WITH CONTRAST
TECHNIQUE: Multidetector CT imaging of the neck was performed using the
standard protocol following the bolus administration of intravenous
contrast.
CONTRAST:  80mL OMNIPAQUE IOHEXOL 300 MG/ML  SOLN

[Series 2: soft tissue · axial · 0.43mm/px · z∈[+95,+311]mm · 8 of 136 slices shown, 10 images]
[im 14/136  soft-tissue]
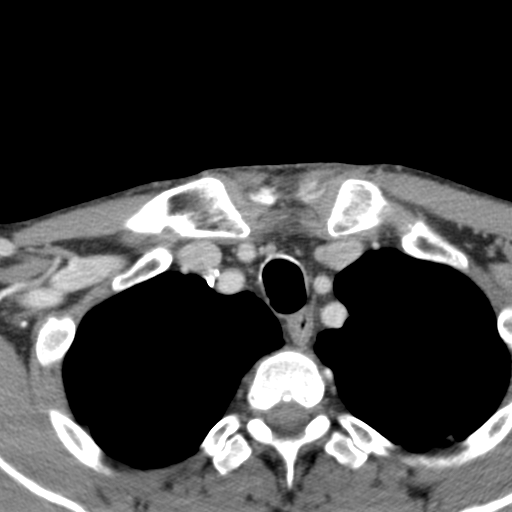
[im 14/136  bone]
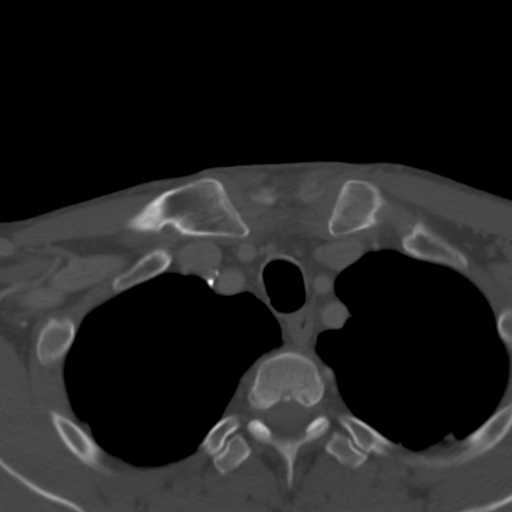
[im 28/136  bone]
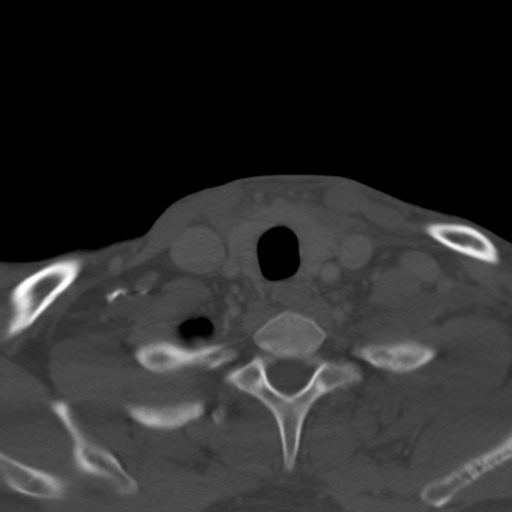
[im 41/136  bone]
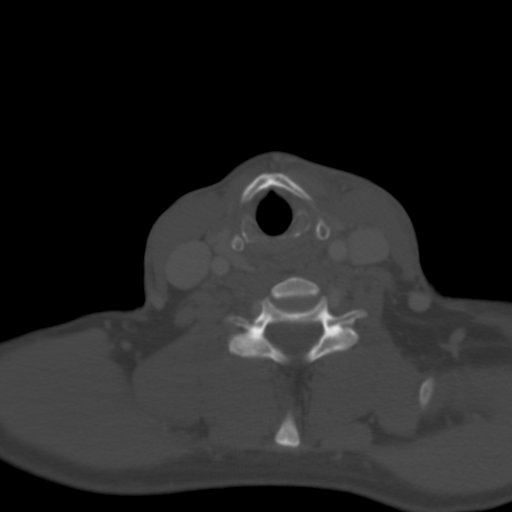
[im 55/136  bone]
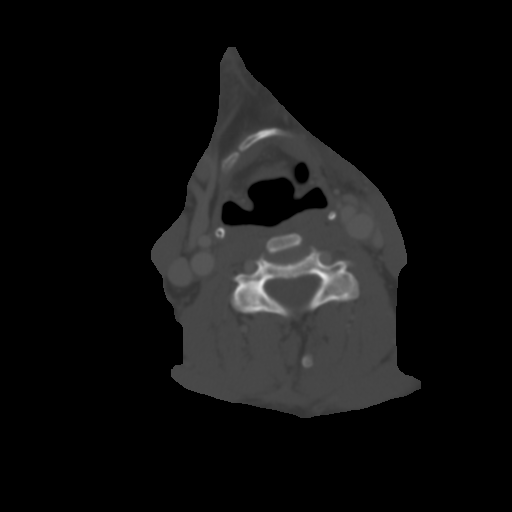
[im 82/136  soft-tissue]
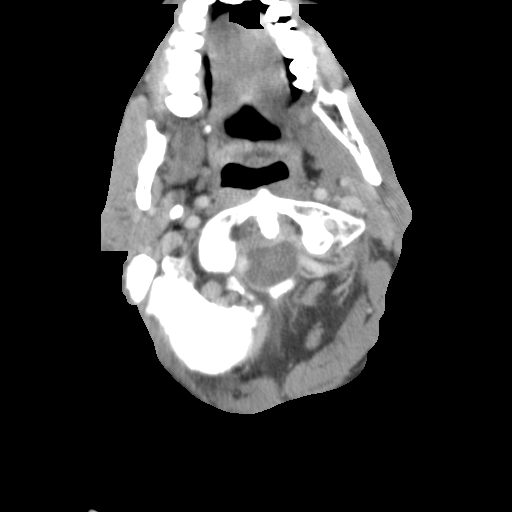
[im 82/136  bone]
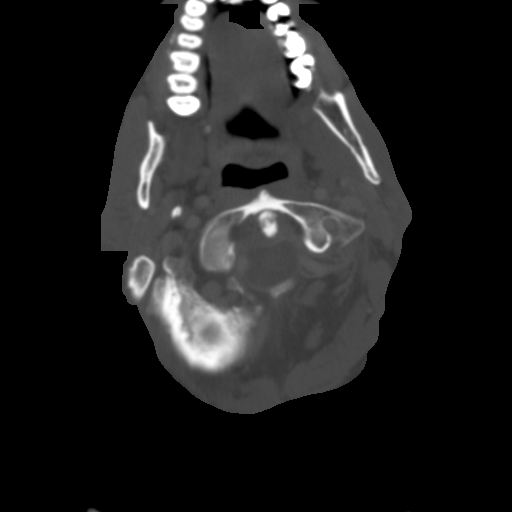
[im 95/136  bone]
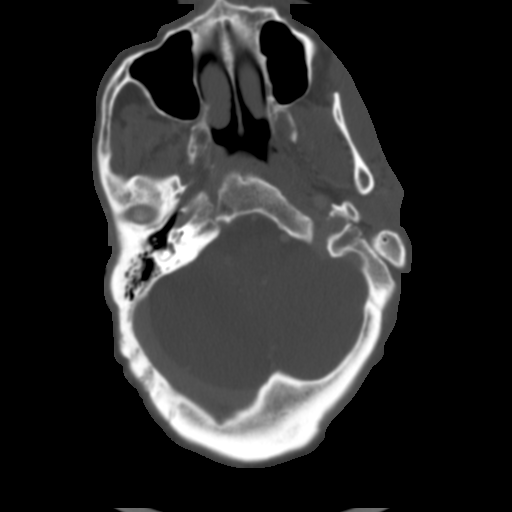
[im 109/136  bone]
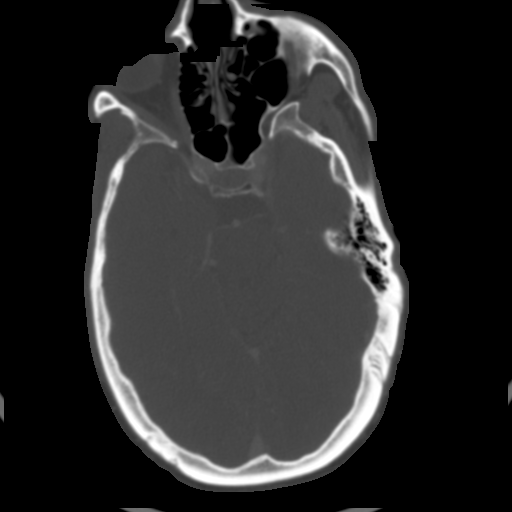
[im 122/136  bone]
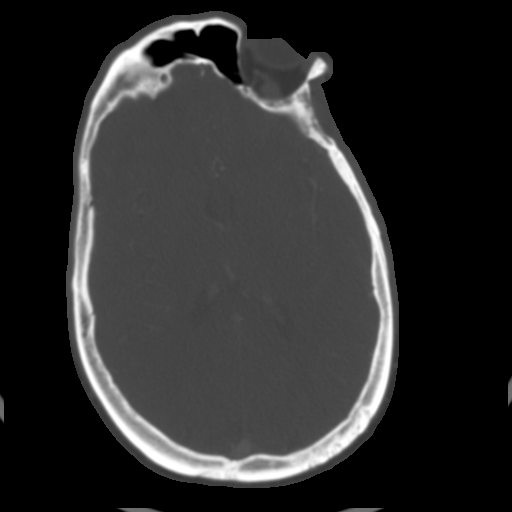

[mpr, sagittal, sagittal · sagittal · 0.53mm/px · 5 of 76 slices shown, 6 images]
[im 26/76  bone]
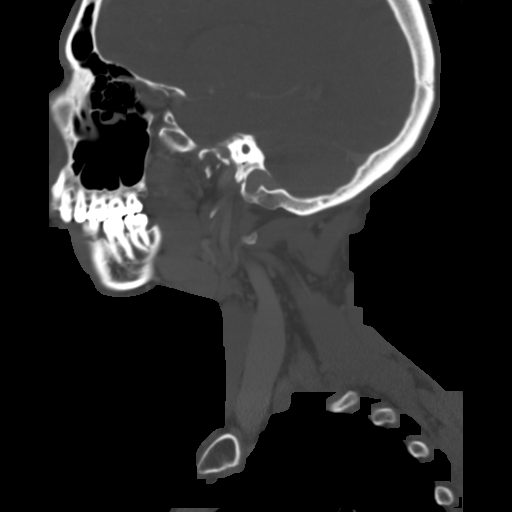
[im 32/76  bone]
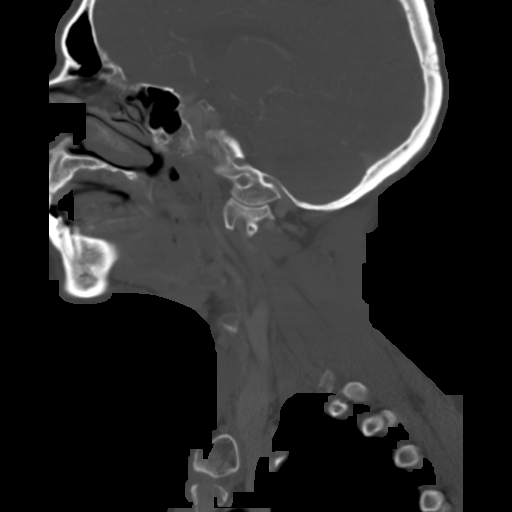
[im 38/76  soft-tissue]
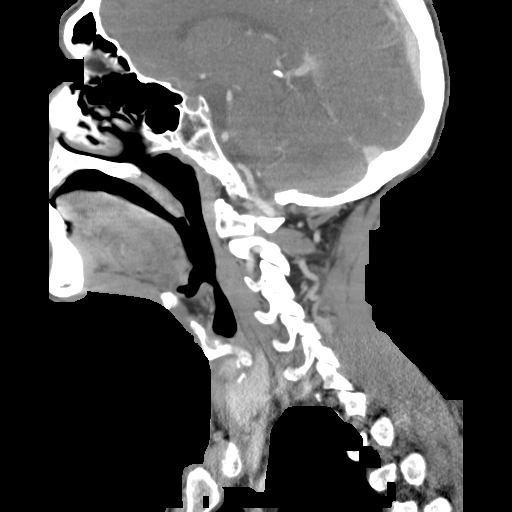
[im 38/76  bone]
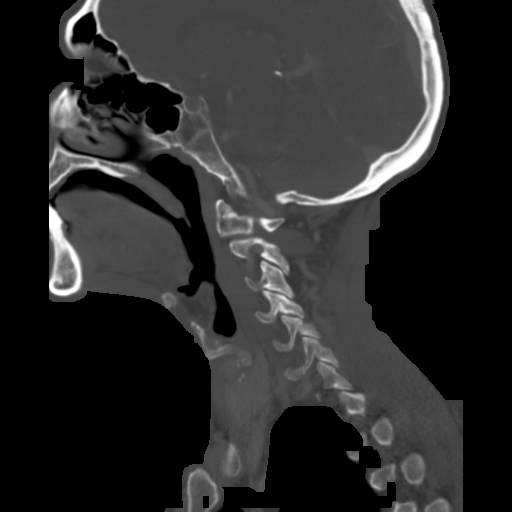
[im 44/76  bone]
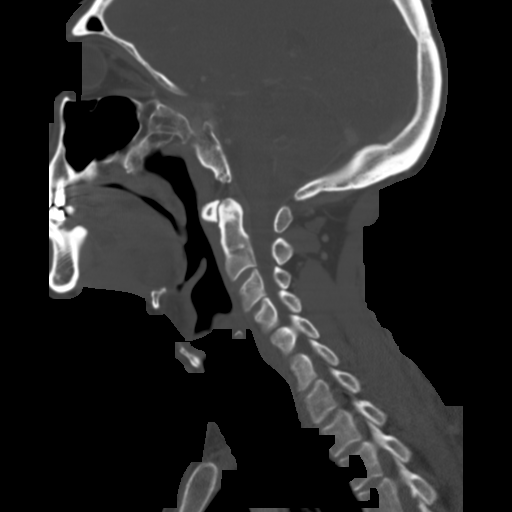
[im 51/76  bone]
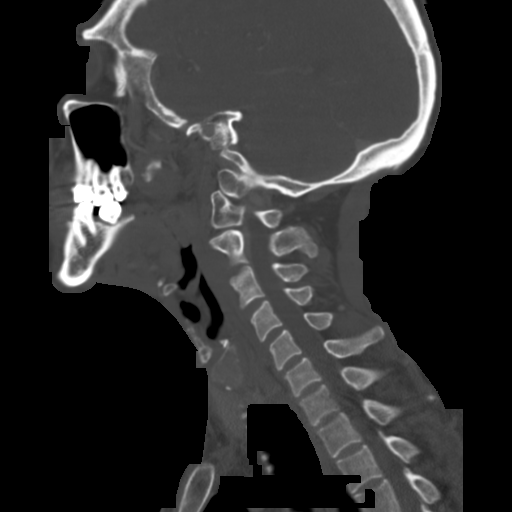

[mpr, coronal, coronal · coronal · 0.53mm/px · 3 of 91 slices shown]
[im 19/91  bone]
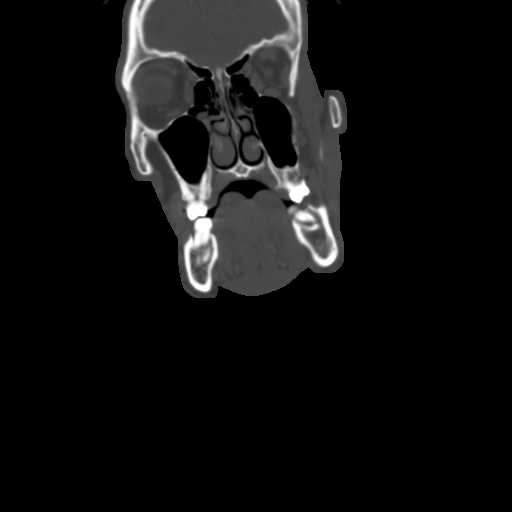
[im 37/91  bone]
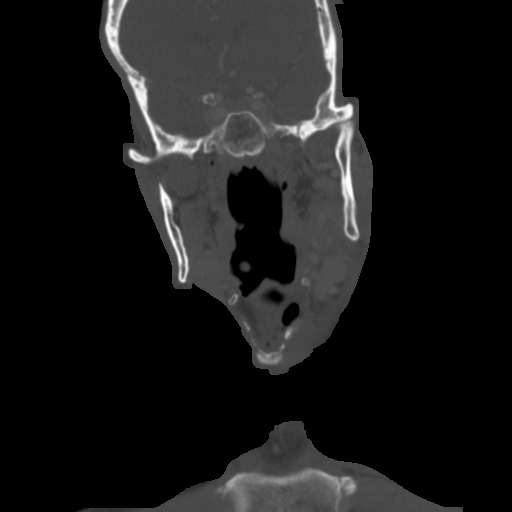
[im 55/91  bone]
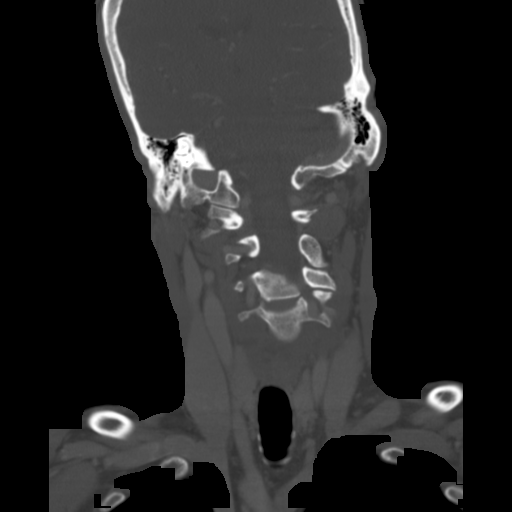

[16 of 33 positions shown; findings below may reference images not displayed]

FINDINGS: There is mainly subcutaneous low-attenuation in the left
submandibular space, extending from the tail of the parotid to the
level of the larynx. The source is not certain, although there may
be mild enlargement and edematous change within the tail of the
parotid. No sialolithiasis seen. No lymphadenopathy or collection.
No deep tissue infection, including the floor of the mouth.

Internal laryngocele on the left, also seen previously. No evidence
of mass along the surfaces of the aerodigestive tract. Other than
the lower left parotid as above, the salivary and thyroid glands are
unremarkable. No significant osseous findings.

Numerous dental caries.

Centrilobular and paraseptal emphysema.
IMPRESSION: Nonspecific superficial left neck edema which could be infectious,
inflammatory, or posttraumatic. The source is not clearly identified
- question mild left superficial parotitis. No abscess.

## 2013-01-24 MED ORDER — OXYCODONE-ACETAMINOPHEN 5-325 MG PO TABS: 1.0000 | ORAL_TABLET | Freq: Four times a day (QID) | ORAL | Status: DC | PRN

## 2013-01-24 MED ORDER — OXYCODONE-ACETAMINOPHEN 5-325 MG PO TABS
1.0000 | ORAL_TABLET | Freq: Once | ORAL | Status: AC
  Administered 2013-01-24: 1 via ORAL
  Filled 2013-01-24: qty 1

## 2013-01-24 MED ORDER — IOHEXOL 300 MG/ML  SOLN
80.0000 mL | Freq: Once | INTRAMUSCULAR | Status: AC | PRN
  Administered 2013-01-24: 80 mL via INTRAVENOUS

## 2013-01-24 MED ORDER — ONDANSETRON HCL 4 MG/2ML IJ SOLN
4.0000 mg | Freq: Once | INTRAMUSCULAR | Status: AC
  Administered 2013-01-24: 4 mg via INTRAVENOUS
  Filled 2013-01-24: qty 2

## 2013-01-24 MED ORDER — SODIUM CHLORIDE 0.9 % IV SOLN
Freq: Once | INTRAVENOUS | Status: AC
  Administered 2013-01-24: 20 mL/h via INTRAVENOUS

## 2013-01-24 MED ORDER — CLINDAMYCIN PHOSPHATE 900 MG/50ML IV SOLN
900.0000 mg | Freq: Once | INTRAVENOUS | Status: AC
  Administered 2013-01-24: 900 mg via INTRAVENOUS
  Filled 2013-01-24: qty 50

## 2013-01-24 MED ORDER — MORPHINE SULFATE 4 MG/ML IJ SOLN
4.0000 mg | Freq: Once | INTRAMUSCULAR | Status: AC
  Administered 2013-01-24: 4 mg via INTRAVENOUS
  Filled 2013-01-24: qty 1

## 2013-01-24 MED ORDER — CEPHALEXIN 250 MG PO CAPS: 250.0000 mg | ORAL_CAPSULE | Freq: Four times a day (QID) | ORAL | Status: DC

## 2013-01-24 NOTE — ED Provider Notes (Signed)
Medical screening examination/treatment/procedure(s) were performed by non-physician practitioner and as supervising physician I was immediately available for consultation/collaboration.  Olivia Mackie, MD 01/24/13 [phone]

## 2013-08-15 ENCOUNTER — Encounter (HOSPITAL_COMMUNITY): Payer: Self-pay | Admitting: Emergency Medicine

## 2013-08-15 ENCOUNTER — Emergency Department (HOSPITAL_COMMUNITY)
Admission: EM | Admit: 2013-08-15 | Discharge: 2013-08-15 | Disposition: A | Discharge status: Home / Self Care | Payer: Self-pay | Attending: Emergency Medicine | Admitting: Emergency Medicine

## 2013-08-15 DIAGNOSIS — Z23 Encounter for immunization: Secondary | ICD-10-CM | POA: Insufficient documentation

## 2013-08-15 DIAGNOSIS — Y9289 Other specified places as the place of occurrence of the external cause: Secondary | ICD-10-CM | POA: Insufficient documentation

## 2013-08-15 DIAGNOSIS — Z79899 Other long term (current) drug therapy: Secondary | ICD-10-CM | POA: Insufficient documentation

## 2013-08-15 DIAGNOSIS — Z8719 Personal history of other diseases of the digestive system: Secondary | ICD-10-CM | POA: Insufficient documentation

## 2013-08-15 DIAGNOSIS — IMO0002 Reserved for concepts with insufficient information to code with codable children: Secondary | ICD-10-CM

## 2013-08-15 DIAGNOSIS — Y990 Civilian activity done for income or pay: Secondary | ICD-10-CM | POA: Insufficient documentation

## 2013-08-15 DIAGNOSIS — S61409A Unspecified open wound of unspecified hand, initial encounter: Secondary | ICD-10-CM | POA: Insufficient documentation

## 2013-08-15 DIAGNOSIS — F17200 Nicotine dependence, unspecified, uncomplicated: Secondary | ICD-10-CM | POA: Insufficient documentation

## 2013-08-15 DIAGNOSIS — Y939 Activity, unspecified: Secondary | ICD-10-CM | POA: Insufficient documentation

## 2013-08-15 DIAGNOSIS — Z87828 Personal history of other (healed) physical injury and trauma: Secondary | ICD-10-CM | POA: Insufficient documentation

## 2013-08-15 DIAGNOSIS — Z791 Long term (current) use of non-steroidal anti-inflammatories (NSAID): Secondary | ICD-10-CM | POA: Insufficient documentation

## 2013-08-15 DIAGNOSIS — W268XXA Contact with other sharp object(s), not elsewhere classified, initial encounter: Secondary | ICD-10-CM | POA: Insufficient documentation

## 2013-08-15 HISTORY — DX: Periapical abscess without sinus: K04.7

## 2013-08-15 HISTORY — DX: Unspecified fracture of sternum, initial encounter for closed fracture: S22.20XA

## 2013-08-15 MED ORDER — HYDROCODONE-ACETAMINOPHEN 5-325 MG PO TABS: 1.0000 | ORAL_TABLET | Freq: Four times a day (QID) | ORAL | Status: DC | PRN

## 2013-08-15 MED ORDER — HYDROCODONE-ACETAMINOPHEN 5-325 MG PO TABS
1.0000 | ORAL_TABLET | Freq: Once | ORAL | Status: AC
  Administered 2013-08-15: 1 via ORAL
  Filled 2013-08-15: qty 1

## 2013-08-15 MED ORDER — TETANUS-DIPHTH-ACELL PERTUSSIS 5-2.5-18.5 LF-MCG/0.5 IM SUSP
0.5000 mL | Freq: Once | INTRAMUSCULAR | Status: AC
  Administered 2013-08-15: 0.5 mL via INTRAMUSCULAR
  Filled 2013-08-15: qty 0.5

## 2013-08-15 NOTE — ED Notes (Signed)
The patient was at work and he said he stumbled and caught a shelf which is raw wood and that is what he hit in between his middle and ring finger on the right hand.  Patient said it stopped bleeding but now his pain is unbearable and he wants it to be looked at.  Patient is mainly concerned with infection.

## 2013-08-15 NOTE — Discharge Instructions (Signed)
The laceration was between the fingers and sutures have been placed  A dressing was placed to help healing  You may change as needed but keep your fingers together as dressed tonight for the next 3-4 days.  The sutures should be removed in 14 days   If you develop any signs of infection including redness, increased pain , purulent drainage please return for further evaluation  in the ED of with Dr. Mina MarbleWeingold

## 2013-08-15 NOTE — ED Provider Notes (Signed)
CSN: [id number]     Arrival date & time 08/15/13  1912 History   First MD Initiated Contact with Patient 08/15/13 2044     Chief Complaint  Patient presents with  . Laceration    Right hand in between the middle and right finger     (Consider location/radiation/quality/duration/timing/severity/associated sxs/prior Treatment) HPI Comments: Patient works in [employer].  He slipped and grabbed a wooden shelf that had not been finished yet.  He sustained a laceration between the third and fourth fingers on the palmar aspect.  Bleeding is controlled.  Last tetanus unknown.  No numbness or tingling to fingers.  Full sensation, and range of motion  Patient is a 48 y.o. male presenting with skin laceration. The history is provided by the patient.  Laceration Location:  Hand Hand laceration location:  R hand Depth:  Cutaneous Quality: straight   Bleeding: controlled   Time since incident:  8 hours Injury mechanism: wood. Pain details:    Quality:  Aching   Severity:  Mild   Timing:  Constant   Progression:  Unchanged Foreign body present:  No foreign bodies Relieved by:  None tried Worsened by:  Nothing tried Ineffective treatments:  None tried Tetanus status:  Out of date   Past Medical History  Diagnosis Date  . Tooth abscess   . Sternum fx    Past Surgical History  Procedure Laterality Date  . Hernia repair    . Eye surgery     Family History  Problem Relation Age of Onset  . Cancer Father    History  Substance Use Topics  . Smoking status: Current Every Day Smoker -- 1.00 packs/day    Types: Cigarettes  . Smokeless tobacco: Former NeurosurgeonUser  . Alcohol Use: 0.0 oz/week     Comment: 1 beer a day    Review of Systems  Constitutional: Negative for fever.  Skin: Positive for wound.  Neurological: Negative for numbness.  All other systems reviewed and are negative.     Allergies  Review of patient's allergies indicates no known allergies.  Home Medications    Current Outpatient Rx  Name  Route  Sig  Dispense  Refill  . cetirizine (ZYRTEC) 10 MG tablet   Oral   Take 10 mg by mouth daily as needed for allergies.         . Cimetidine (ACID REDUCER PO)   Oral   Take 1 tablet by mouth daily.         . Multiple Vitamin (MULTIVITAMIN WITH MINERALS) TABS tablet   Oral   Take 1 tablet by mouth daily.         . naproxen sodium (ANAPROX) 220 MG tablet   Oral   Take 440 mg by mouth daily.         Marland Kitchen. HYDROcodone-acetaminophen (NORCO/VICODIN) 5-325 MG per tablet   Oral   Take 1 tablet by mouth every 6 (six) hours as needed for moderate pain.   10 tablet   0    BP 112/69  Pulse 81  Temp(Src) 98.2 F (36.8 C) (Oral)  Resp 22  SpO2 97% Physical Exam  Nursing note and vitals reviewed. Constitutional: He appears well-developed and well-nourished.  HENT:  Head: Normocephalic.  Eyes: Pupils are equal, round, and reactive to light.  Neck: Normal range of motion.  Cardiovascular: Normal rate and regular rhythm.   Pulmonary/Chest: Effort normal.  Musculoskeletal: Normal range of motion. He exhibits no edema and no tenderness.  Hands: 1 cm laceration  Full range of motion of both third and fourth fingers.  No numbness or tingling  Neurological: He is alert.  Skin: Skin is warm. No erythema.    ED Course  LACERATION REPAIR Date/Time: 08/15/2013 9:26 PM Performed by: Arman FilterSCHULZ, Chason Mciver K Authorized by: Arman FilterSCHULZ, Deanie Jupiter K Consent: Verbal consent obtained. written consent not obtained. Risks and benefits: risks, benefits and alternatives were discussed Consent given by: patient Patient understanding: patient states understanding of the procedure being performed Patient identity confirmed: verbally with patient Body area: upper extremity Location details: right hand Laceration length: 1 cm Foreign bodies: no foreign bodies Tendon involvement: none Nerve involvement: none Vascular damage: no Anesthesia: local infiltration Local  anesthetic: lidocaine 1% without epinephrine Anesthetic total: 1 ml Patient sedated: no Preparation: Patient was prepped and draped in the usual sterile fashion. Irrigation solution: saline Amount of cleaning: standard Debridement: none Degree of undermining: none Skin closure: 4-0 Prolene Number of sutures: 2 Technique: simple Approximation difficulty: simple Dressing: antibiotic ointment and 4x4 sterile gauze Patient tolerance: Patient tolerated the procedure well with no immediate complications. Comments: Fingers were buddy taped after being padded, and dressed.  Patient has been instructed to keep this type of dressing on for the next 3-4, days   (including critical care time) Labs Review Labs Reviewed - No data to display Imaging Review No results found.   EKG Interpretation None      MDM  Wound is explored there are no foreign bodies evident.  Patient has full range of motion of both third and fourth fingers.  Pain does not radiate.  Past the laceration.  The laceration was explored.  No foreign bodies evident.  Patient's tetanus was updated.  He is been given strict return precautions for signs of infection, as well as referral to Dr. Reita ClicheWeinhold, for followup as needed.  Sutures are to be removed in 14 days Final diagnoses:  Laceration         Arman FilterGail K Sander Remedios, NP 08/15/13 2128

## 2013-08-15 NOTE — ED Notes (Signed)
Stumbled and reached out to brace himself on a bookshelf and wood cut into hand. Reports this happening this AM and continued working throughout the day until the pain became too intense and he came into ED

## 2013-08-16 NOTE — ED Provider Notes (Signed)
Medical screening examination/treatment/procedure(s) were performed by non-physician practitioner and as supervising physician I was immediately available for consultation/collaboration.   EKG Interpretation None        Romain Erion T Avelynn Sellin, MD 08/16/13 1524 

## 2013-08-29 ENCOUNTER — Encounter (HOSPITAL_COMMUNITY): Payer: Self-pay | Admitting: Emergency Medicine

## 2013-08-29 ENCOUNTER — Emergency Department (HOSPITAL_COMMUNITY)
Admission: EM | Admit: 2013-08-29 | Discharge: 2013-08-29 | Disposition: A | Discharge status: Home / Self Care | Payer: Self-pay | Attending: Emergency Medicine | Admitting: Emergency Medicine

## 2013-08-29 DIAGNOSIS — Z791 Long term (current) use of non-steroidal anti-inflammatories (NSAID): Secondary | ICD-10-CM | POA: Insufficient documentation

## 2013-08-29 DIAGNOSIS — Z8719 Personal history of other diseases of the digestive system: Secondary | ICD-10-CM | POA: Insufficient documentation

## 2013-08-29 DIAGNOSIS — Z5189 Encounter for other specified aftercare: Secondary | ICD-10-CM

## 2013-08-29 DIAGNOSIS — Z79899 Other long term (current) drug therapy: Secondary | ICD-10-CM | POA: Insufficient documentation

## 2013-08-29 DIAGNOSIS — F17200 Nicotine dependence, unspecified, uncomplicated: Secondary | ICD-10-CM | POA: Insufficient documentation

## 2013-08-29 DIAGNOSIS — Z8781 Personal history of (healed) traumatic fracture: Secondary | ICD-10-CM | POA: Insufficient documentation

## 2013-08-29 DIAGNOSIS — Z4801 Encounter for change or removal of surgical wound dressing: Secondary | ICD-10-CM | POA: Insufficient documentation

## 2013-08-29 NOTE — ED Notes (Signed)
Pt reports that he was supposed to have stitches removed today but on Saturday they "busted". No signs of infection.

## 2013-08-29 NOTE — Discharge Instructions (Signed)
Scar Minimization  You will have a scar anytime you have surgery and a cut is made in the skin or you have something removed from your skin (mole, skin cancer, cyst). Although scars are unavoidable following surgery, there are ways to minimize their appearance.  It is important to follow all the instructions you receive from your caregiver about wound care. How your wound heals will influence the appearance of your scar. If you do not follow the wound care instructions as directed, complications such as infection may occur. Wound instructions include keeping the wound clean, moist, and not letting the wound form a scab. Some people form scars that are raised and lumpy (hypertrophic) or larger than the initial wound (keloidal).  HOME CARE INSTRUCTIONS   · Follow wound care instructions as directed.  · Keep the wound clean by washing it with soap and water.  · Keep the wound moist with provided antibiotic cream or petroleum jelly until completely healed. Moisten twice a day for about 2 weeks.  · Get stitches (sutures) taken out at the scheduled time.  · Avoid touching or manipulating your wound unless needed. Wash your hands thoroughly before and after touching your wound.  · Follow all restrictions such as limits on exercise or work. This depends on where your scar is located.  · Keep the scar protected from sunburn. Cover the scar with sunscreen/sunblock with SPF 30 or higher.  · Gently massage the scar using a circular motion to help minimize the appearance of the scar. Do this only after the wound has closed and all the sutures have been removed.  · For hypertrophic or keloidal scars, there are several ways to treat and minimize their appearance. Methods include compression therapy, intralesional corticosteroids, laser therapy, or surgery. These methods are performed by your caregiver.  Remember that the scar may appear lighter or darker than your normal skin color. This difference in color should even out with  time.  SEEK MEDICAL CARE IF:   · You have a fever.  · You develop signs of infection such as pain, redness, pus, and warmth.  · You have questions or concerns.  Document Released: 10/09/2009 Document Revised: 07/14/2011 Document Reviewed: 10/09/2009  [employer]® Patient Information ©2014 [employer].

## 2013-08-29 NOTE — ED Provider Notes (Signed)
CSN: [id number]     Arrival date & time 08/29/13  1751 History  This chart was scribed for non-physician practitioner, Marlon Peliffany Jeda Pardue, PA-C,working with Ethelda ChickMartha K Linker, MD, by Karle PlumberJennifer Tensley, ED Scribe.  This patient was seen in room TR07C/TR07C and the patient's care was started at 7:19 PM.  Chief Complaint  Patient presents with  . Suture / Staple Removal   The history is provided by the patient. No language interpreter was used.   HPI Comments:  Evan Lyons is a 48 y.o. male who presents to the Emergency Department needing suture removal from a wound to the palmar aspect of his right hand that occurred about one week ago. Pt states he believes that the sutures came out on their own yesterday. He denies any bleeding, redness, swelling, drainage, or fever.   Past Medical History  Diagnosis Date  . Tooth abscess   . Sternum fx    Past Surgical History  Procedure Laterality Date  . Hernia repair    . Eye surgery     Family History  Problem Relation Age of Onset  . Cancer Father    History  Substance Use Topics  . Smoking status: Current Every Day Smoker -- 1.00 packs/day    Types: Cigarettes  . Smokeless tobacco: Former NeurosurgeonUser  . Alcohol Use: 0.0 oz/week     Comment: 1 beer a day    Review of Systems  Constitutional: Negative for fever.  Skin: Positive for wound (well-healing wound to palmar aspect of right hand). Negative for color change.  All other systems reviewed and are negative.   Allergies  Review of patient's allergies indicates no known allergies.  Home Medications   Prior to Admission medications   Medication Sig Start Date End Date Taking? Authorizing Provider  cetirizine (ZYRTEC) 10 MG tablet Take 10 mg by mouth daily as needed for allergies.    Historical Provider, MD  Cimetidine (ACID REDUCER PO) Take 1 tablet by mouth daily.    Historical Provider, MD  HYDROcodone-acetaminophen (NORCO/VICODIN) 5-325 MG per tablet Take 1 tablet by mouth every 6  (six) hours as needed for moderate pain. 08/15/13   Arman FilterGail K Schulz, NP  Multiple Vitamin (MULTIVITAMIN WITH MINERALS) TABS tablet Take 1 tablet by mouth daily.    Historical Provider, MD  naproxen sodium (ANAPROX) 220 MG tablet Take 440 mg by mouth daily.    Historical Provider, MD   Triage Vitals: BP 118/73  Pulse 86  Temp(Src) 98.5 F (36.9 C) (Oral)  Resp 18  SpO2 95% Physical Exam  Nursing note and vitals reviewed. Constitutional: He is oriented to person, place, and time. He appears well-developed and well-nourished.  HENT:  Head: Normocephalic and atraumatic.  Eyes: EOM are normal.  Neck: Normal range of motion.  Cardiovascular: Normal rate.   Pulmonary/Chest: Effort normal.  Musculoskeletal: Normal range of motion.  Neurological: He is alert and oriented to person, place, and time.  Skin: Skin is warm and dry. No erythema.  Between middle and index finger of the right hand there is a small 1 cm laceration that is almost completely healed. No signs of infection.  Psychiatric: He has a normal mood and affect. His behavior is normal.    ED Course  Procedures (including critical care time) DIAGNOSTIC STUDIES: Oxygen Saturation is 95% on RA, adequate by my interpretation.   COORDINATION OF CARE: 7:21 PM- Advised pt to be careful with hand while it continues to heal. No further treatment needed at this time. Pt  verbalizes understanding and agrees to plan.  Medications - No data to display  Labs Review Labs Reviewed - No data to display  Imaging Review No results found.   EKG Interpretation None      MDM   Final diagnoses:  Visit for wound check   47 y.o.Evan Lyons's evaluation in the Emergency Department is complete. It has been determined that no acute conditions requiring further emergency intervention are present at this time. The patient/guardian have been advised of the diagnosis and plan. We have discussed signs and symptoms that warrant return to the ED,  such as changes or worsening in symptoms.  Vital signs are stable at discharge. Filed Vitals:   08/29/13 1803  BP: 118/73  Pulse: 86  Temp: 98.5 F (36.9 C)  Resp: 18    Patient/guardian has voiced understanding and agreed to follow-up with the PCP or specialist.   I personally performed the services described in this documentation, which was scribed in my presence. The recorded information has been reviewed and is accurate.    Dorthula Matasiffany G Jamine Highfill, PA-C 08/30/13 2130

## 2013-08-30 NOTE — ED Provider Notes (Signed)
Medical screening examination/treatment/procedure(s) were performed by non-physician practitioner and as supervising physician I was immediately available for consultation/collaboration.   EKG Interpretation None       Ethelda ChickMartha K Linker, MD 08/30/13 2132

## 2013-10-14 ENCOUNTER — Ambulatory Visit (INDEPENDENT_AMBULATORY_CARE_PROVIDER_SITE_OTHER): Payer: Self-pay | Admitting: Family Medicine

## 2013-10-14 ENCOUNTER — Encounter: Payer: Self-pay | Admitting: Family Medicine

## 2013-10-14 VITALS — BP 122/73 | HR 75 | Temp 98.1°F | Resp 20 | Ht 72.0 in | Wt 142.0 lb

## 2013-10-14 DIAGNOSIS — Z7189 Other specified counseling: Secondary | ICD-10-CM

## 2013-10-14 DIAGNOSIS — J302 Other seasonal allergic rhinitis: Secondary | ICD-10-CM

## 2013-10-14 DIAGNOSIS — Z7689 Persons encountering health services in other specified circumstances: Secondary | ICD-10-CM

## 2013-10-14 DIAGNOSIS — R209 Unspecified disturbances of skin sensation: Secondary | ICD-10-CM

## 2013-10-14 DIAGNOSIS — R200 Anesthesia of skin: Secondary | ICD-10-CM

## 2013-10-14 DIAGNOSIS — J309 Allergic rhinitis, unspecified: Secondary | ICD-10-CM

## 2013-10-14 DIAGNOSIS — F17200 Nicotine dependence, unspecified, uncomplicated: Secondary | ICD-10-CM

## 2013-10-14 DIAGNOSIS — R12 Heartburn: Secondary | ICD-10-CM

## 2013-10-14 NOTE — Progress Notes (Signed)
   Subjective:    Patient ID: Evan BellowDouglas W Mcelhaney, male    DOB: 1966/03/10, 48 y.o.   MRN: [id number]  HPI Patient presents to office to establish care.   Patient complaining of periodic numbness to the tip of the right third finger. He states that numbness occurs periodically. Maintains that it does not interfere with daily functioning. Patient reports that is sustained an injury to the right palmar aspect of hand between the 3rd and 4th finger 1 month ago. Sutures were placed in the ER, he returned to he ER for suture removal. He states that he removed 1 of the sutures himself. Currently denies numbness, tingling, or pain.   Patient reports that he has been smoking cigarettes for 29 years. He states that he has attempted patches in the past without success. Maintains that he smoke a 1/2 pack per day.    Review of Systems  Constitutional: Negative.   HENT: Negative.   Eyes: Negative.   Respiratory: Negative.   Cardiovascular: Negative.   Gastrointestinal: Negative.   Endocrine: Negative.   Genitourinary: Negative.   Musculoskeletal: Negative.   Skin: Negative.   Allergic/Immunologic: Negative.   Neurological: Positive for numbness (Right 3 finger tip, palmer aspect, intermittently).  Hematological: Negative.   Psychiatric/Behavioral: Negative.        Objective:   Physical Exam  Constitutional: He is oriented to person, place, and time. He appears well-developed and well-nourished.  HENT:  Head: Normocephalic and atraumatic.  Right Ear: External ear normal.  Left Ear: External ear normal.  Eyes: Conjunctivae are normal. Pupils are equal, round, and reactive to light.  Neck: Normal range of motion. Neck supple.  Cardiovascular: Normal rate, regular rhythm and normal heart sounds.   Pulmonary/Chest: Effort normal and breath sounds normal.  Abdominal: Soft. Bowel sounds are normal.  Musculoskeletal: Normal range of motion.  Neurological: He is alert and oriented to person, place,  and time.  Skin: Skin is warm and dry.  Psychiatric: He has a normal mood and affect. His speech is normal and behavior is normal. Judgment and thought content normal. Cognition and memory are normal.    BP 122/73  Pulse 75  Temp(Src) 98.1 F (36.7 C) (Oral)  Resp 20  Ht 6' (1.829 m)  Wt 142 lb (64.411 kg)  BMI 19.25 kg/m2      Assessment & Plan:   1. Right 3rd finger numbness: Reports intermittent numbness to right 3rd finger.  There are no signs of infection and fingertip is not numb at present.   2. Tobacco abuse: 1/2 pack per day. He reports that he has tried nicotine patches in the past without success. He is not interested in smoking cessation at the moment. Report that he has been smoking for 29 years.  Patient is in the pre-contemplation stage.   3. Seasonal and environmental allergies: Patient states that he has allergies to dust and pollen. He typically takes Certirizine daily to alleviate symptoms, which has been working well for him.   4. Heart burn: States that he has heartburn after eating spicy foods. Reports that heartburn is relieved by OTC cimetidine. Recommend that patient remain upright for 1 hours prior to eating, eat small meals, avoid spicy foods, citrus, mint candy, chocolate, coffee,etc. Discussed at length.   RTC: 6 months for CPE with Dr. Wynona CanesMatthews Cassell Voorhies M, FNP

## 2013-10-18 DIAGNOSIS — F17200 Nicotine dependence, unspecified, uncomplicated: Secondary | ICD-10-CM | POA: Insufficient documentation

## 2013-10-18 DIAGNOSIS — R200 Anesthesia of skin: Secondary | ICD-10-CM | POA: Insufficient documentation

## 2013-10-18 DIAGNOSIS — R12 Heartburn: Secondary | ICD-10-CM | POA: Insufficient documentation

## 2013-10-18 DIAGNOSIS — J302 Other seasonal allergic rhinitis: Secondary | ICD-10-CM | POA: Insufficient documentation

## 2013-10-18 DIAGNOSIS — Z7689 Persons encountering health services in other specified circumstances: Secondary | ICD-10-CM | POA: Insufficient documentation

## 2013-11-02 ENCOUNTER — Ambulatory Visit: Payer: Self-pay

## 2014-01-12 ENCOUNTER — Ambulatory Visit: Payer: Self-pay

## 2014-04-13 ENCOUNTER — Encounter: Payer: Self-pay | Admitting: Internal Medicine

## 2014-04-17 ENCOUNTER — Encounter: Payer: Self-pay | Admitting: Family Medicine

## 2014-05-08 ENCOUNTER — Encounter: Payer: Self-pay | Admitting: Family Medicine

## 2014-05-08 ENCOUNTER — Ambulatory Visit (INDEPENDENT_AMBULATORY_CARE_PROVIDER_SITE_OTHER): Payer: Self-pay | Admitting: Family Medicine

## 2014-05-08 VITALS — BP 132/77 | HR 73 | Temp 98.0°F | Resp 16 | Ht 72.0 in | Wt 153.0 lb

## 2014-05-08 DIAGNOSIS — Z0000 Encounter for general adult medical examination without abnormal findings: Secondary | ICD-10-CM | POA: Insufficient documentation

## 2014-05-08 DIAGNOSIS — Z23 Encounter for immunization: Secondary | ICD-10-CM

## 2014-05-08 DIAGNOSIS — F17200 Nicotine dependence, unspecified, uncomplicated: Secondary | ICD-10-CM

## 2014-05-08 DIAGNOSIS — R350 Frequency of micturition: Secondary | ICD-10-CM

## 2014-05-08 LAB — COMPLETE METABOLIC PANEL WITH GFR
ALT: 23 U/L (ref 0–53)
AST: 19 U/L (ref 0–37)
Albumin: 3.9 g/dL (ref 3.5–5.2)
Alkaline Phosphatase: 63 U/L (ref 39–117)
BUN: 12 mg/dL (ref 6–23)
CHLORIDE: 100 meq/L (ref 96–112)
CO2: 30 meq/L (ref 19–32)
CREATININE: 0.79 mg/dL (ref 0.50–1.35)
Calcium: 9.4 mg/dL (ref 8.4–10.5)
GFR, Est African American: >89 mL/min
GLUCOSE: 96 mg/dL (ref 70–99)
Potassium: 4.4 mEq/L (ref 3.5–5.3)
Sodium: 139 mEq/L (ref 135–145)
Total Bilirubin: 0.3 mg/dL (ref 0.2–1.2)
Total Protein: 6.1 g/dL (ref 6.0–8.3)

## 2014-05-08 LAB — CBC WITH DIFFERENTIAL/PLATELET
Basophils Absolute: 0.1 10*3/uL (ref 0.0–0.1)
Basophils Relative: 1 % (ref 0–1)
Eosinophils Absolute: 0.5 10*3/uL (ref 0.0–0.7)
Eosinophils Relative: 6 % — ABNORMAL HIGH (ref 0–5)
HCT: 45.7 % (ref 39.0–52.0)
HEMOGLOBIN: 16 g/dL (ref 13.0–17.0)
LYMPHS ABS: 1.2 10*3/uL (ref 0.7–4.0)
Lymphocytes Relative: 14 % (ref 12–46)
MCH: 31.4 pg (ref 26.0–34.0)
MCHC: 35 g/dL (ref 30.0–36.0)
MCV: 89.8 fL (ref 78.0–100.0)
MONOS PCT: 8 % (ref 3–12)
MPV: 10 fL (ref 8.6–12.4)
Monocytes Absolute: 0.7 10*3/uL (ref 0.1–1.0)
NEUTROS ABS: 5.9 10*3/uL (ref 1.7–7.7)
NEUTROS PCT: 71 % (ref 43–77)
Platelets: 383 10*3/uL (ref 150–400)
RBC: 5.09 MIL/uL (ref 4.22–5.81)
RDW: 14.6 % (ref 11.5–15.5)
WBC: 8.3 10*3/uL (ref 4.0–10.5)

## 2014-05-08 LAB — TSH: TSH: 0.947 u[IU]/mL (ref 0.350–4.500)

## 2014-05-08 LAB — LIPID PANEL
CHOLESTEROL: 181 mg/dL (ref 0–200)
HDL: 58 mg/dL (ref >39)
LDL Cholesterol: 86 mg/dL (ref 0–99)
TRIGLYCERIDES: 185 mg/dL — AB (ref <150)
Total CHOL/HDL Ratio: 3.1 Ratio
VLDL: 37 mg/dL (ref 0–40)

## 2014-05-08 MED ORDER — VARENICLINE TARTRATE 1 MG PO TABS
1.0000 mg | ORAL_TABLET | Freq: Two times a day (BID) | ORAL | Status: DC
Start: 2014-05-08 — End: 2014-05-17

## 2014-05-08 MED ORDER — VARENICLINE TARTRATE 0.5 MG PO TABS: 0.5000 mg | ORAL_TABLET | Freq: Two times a day (BID) | ORAL | Status: AC

## 2014-05-08 NOTE — Progress Notes (Signed)
Subjective:    Patient ID: Evan Lyons, male    DOB: 23-Nov-1965, 49 y.o.   MRN: [id number]  HPI  Evan Lyons presents for a annual physical examination. He reports that he has never had a complete physical examination. He states that he recently had an influenza vaccination. He currently smokes 1 pack of cigarettes per day and reports that he is ready to quit. He is not sexually active. Evan Lyons has never had a colonoscopy.  Patient here for a comprehensive physical exam. Lastly, patient is taking a multivitamin.   Patient is also complaining of urinary frequency for 2 weeks. He reports that he has been getting up throughout the night to urinate.  Patient denies back pain, congestion, headache, rhinitis, sorethroat and stomach ache. Patient does not have a history of recurrent UTI.  Patient does not have a history of pyelonephritis. He denies attempting any OTC intervention to alleviate symptoms.   Past Medical History  Diagnosis Date  . Tooth abscess   . Sternum fx    Review of Systems  Constitutional: Negative.  Negative for fever and fatigue.  HENT: Positive for postnasal drip.   Eyes: Negative.  Negative for photophobia and visual disturbance.  Respiratory: Negative.  Negative for shortness of breath and wheezing.   Cardiovascular: Negative.  Negative for palpitations and leg swelling.  Gastrointestinal: Negative.  Negative for nausea, diarrhea and constipation.  Endocrine: Negative.  Negative for polydipsia, polyphagia and polyuria.  Genitourinary: Positive for urgency and frequency.  Musculoskeletal: Negative.   Skin: Negative.   Allergic/Immunologic: Negative.   Neurological: Negative.  Negative for numbness.  Hematological: Negative.   Psychiatric/Behavioral: Negative.  Negative for suicidal ideas and sleep disturbance.       Objective:   Physical Exam  Constitutional: He is oriented to person, place, and time. He appears well-developed and well-nourished. He is  active.  HENT:  Head: Normocephalic and atraumatic.  Right Ear: Hearing, tympanic membrane, external ear and ear canal normal.  Left Ear: Hearing, tympanic membrane, external ear and ear canal normal.  Nose: Nose normal.  Mouth/Throat: Oropharynx is clear and moist and mucous membranes are normal.  Eyes: Conjunctivae and EOM are normal. Pupils are equal, round, and reactive to light.  Neck: Trachea normal and normal range of motion. Neck supple.  Cardiovascular: Normal rate, regular rhythm, normal heart sounds and intact distal pulses.   Abdominal: Soft. Bowel sounds are normal.  Genitourinary: Rectum normal, prostate normal and penis normal.  Neurological: He is alert and oriented to person, place, and time. He has normal reflexes.  Skin: Skin is warm and dry.  Psychiatric: He has a normal mood and affect. His behavior is normal. Judgment and thought content normal.      BP 132/77 mmHg  Pulse 73  Temp(Src) 98 F (36.7 C) (Oral)  Resp 16  Ht 6' (1.829 m)  Wt 153 lb (69.4 kg)  BMI 20.75 kg/m2    Assessment & Plan:  1. Annual physical exam - Urinalysis, Complete - COMPLETE METABOLIC PANEL WITH GFR - CBC with Differential - Lipid panel - TSH - Fecal Occult Blood, Guaiac  2. Immunization due - Pneumococcal polysaccharide vaccine 23-valent greater than or equal to 2yo subcutaneous/IM  3. Tobacco dependence Evan Lyons is ready to quit smoking. We discussed smoking on  - varenicline (CHANTIX CONTINUING MONTH PAK) 1 MG tablet; Take 1 tablet (1 mg total) by mouth 2 (two) times daily.  Dispense: 60 tablet; Refill: 0 - varenicline (CHANTIX) 0.5  MG tablet; Take 1 tablet (0.5 mg total) by mouth 2 (two) times daily.  Dispense: 12 tablet; Refill: 0  4. Urinary frequency He reports frequent urination for the past 2 weeks. He reports that he is getting up throughout the night. I will check a urinalysis. He reports that he has been drinking carbonated drinks and beer every other day. I  recommend that he decrease beer and soft drinks. I recommend  5-6 glasses of water daily.    Massie Maroon, FNP

## 2014-05-08 NOTE — Patient Instructions (Signed)
Smoking Cessation  Quitting smoking is important to your health and has many advantages. However, it is not always easy to quit since nicotine is a very addictive drug. Oftentimes, people try 3 times or more before being able to quit. This document explains the best ways for you to prepare to quit smoking. Quitting takes hard work and a lot of effort, but you can do it.  ADVANTAGES OF QUITTING SMOKING   You will live longer, feel better, and live better.   Your body will feel the impact of quitting smoking almost immediately.   Within 20 minutes, blood pressure decreases. Your pulse returns to its normal level.   After 8 hours, carbon monoxide levels in the blood return to normal. Your oxygen level increases.   After 24 hours, the chance of having a heart attack starts to decrease. Your breath, hair, and body stop smelling like smoke.   After 48 hours, damaged nerve endings begin to recover. Your sense of taste and smell improve.   After 72 hours, the body is virtually free of nicotine. Your bronchial tubes relax and breathing becomes easier.   After 2 to 12 weeks, lungs can hold more air. Exercise becomes easier and circulation improves.   The risk of having a heart attack, stroke, cancer, or lung disease is greatly reduced.   After 1 year, the risk of coronary heart disease is cut in half.   After 5 years, the risk of stroke falls to the same as a nonsmoker.   After 10 years, the risk of lung cancer is cut in half and the risk of other cancers decreases significantly.   After 15 years, the risk of coronary heart disease drops, usually to the level of a nonsmoker.   If you are pregnant, quitting smoking will improve your chances of having a healthy baby.   The people you live with, especially any children, will be healthier.   You will have extra money to spend on things other than cigarettes.  QUESTIONS TO THINK ABOUT BEFORE ATTEMPTING TO QUIT  You may want to talk about your answers with your  health care provider.   Why do you want to quit?   If you tried to quit in the past, what helped and what did not?   What will be the most difficult situations for you after you quit? How will you plan to handle them?   Who can help you through the tough times? Your family? Friends? A health care provider?   What pleasures do you get from smoking? What ways can you still get pleasure if you quit?  Here are some questions to ask your health care provider:   How can you help me to be successful at quitting?   What medicine do you think would be best for me and how should I take it?   What should I do if I need more help?   What is smoking withdrawal like? How can I get information on withdrawal?  GET READY   Set a quit date.   Change your environment by getting rid of all cigarettes, ashtrays, matches, and lighters in your home, car, or work. Do not let people smoke in your home.   Review your past attempts to quit. Think about what worked and what did not.  GET SUPPORT AND ENCOURAGEMENT  You have a better chance of being successful if you have help. You can get support in many ways.   Tell your family, friends, and   coworkers that you are going to quit and need their support. Ask them not to smoke around you.   Get individual, group, or telephone counseling and support. Programs are available at local hospitals and health centers. Call your local health department for information about programs in your area.   Spiritual beliefs and practices may help some smokers quit.   Download a "quit meter" on your computer to keep track of quit statistics, such as how long you have gone without smoking, cigarettes not smoked, and money saved.   Get a self-help book about quitting smoking and staying off tobacco.  LEARN NEW SKILLS AND BEHAVIORS   Distract yourself from urges to smoke. Talk to someone, go for a walk, or occupy your time with a task.   Change your normal routine. Take a different route to work.  Drink tea instead of coffee. Eat breakfast in a different place.   Reduce your stress. Take a hot bath, exercise, or read a book.   Plan something enjoyable to do every day. Reward yourself for not smoking.   Explore interactive web-based programs that specialize in helping you quit.  GET MEDICINE AND USE IT CORRECTLY  Medicines can help you stop smoking and decrease the urge to smoke. Combining medicine with the above behavioral methods and support can greatly increase your chances of successfully quitting smoking.   Nicotine replacement therapy helps deliver nicotine to your body without the negative effects and risks of smoking. Nicotine replacement therapy includes nicotine gum, lozenges, inhalers, nasal sprays, and skin patches. Some may be available over-the-counter and others require a prescription.   Antidepressant medicine helps people abstain from smoking, but how this works is unknown. This medicine is available by prescription.   Nicotinic receptor partial agonist medicine simulates the effect of nicotine in your brain. This medicine is available by prescription.  Ask your health care provider for advice about which medicines to use and how to use them based on your health history. Your health care provider will tell you what side effects to look out for if you choose to be on a medicine or therapy. Carefully read the information on the package. Do not use any other product containing nicotine while using a nicotine replacement product.   RELAPSE OR DIFFICULT SITUATIONS  Most relapses occur within the first 3 months after quitting. Do not be discouraged if you start smoking again. Remember, most people try several times before finally quitting. You may have symptoms of withdrawal because your body is used to nicotine. You may crave cigarettes, be irritable, feel very hungry, cough often, get headaches, or have difficulty concentrating. The withdrawal symptoms are only temporary. They are strongest  when you first quit, but they will go away within 10-14 days.  To reduce the chances of relapse, try to:   Avoid drinking alcohol. Drinking lowers your chances of successfully quitting.   Reduce the amount of caffeine you consume. Once you quit smoking, the amount of caffeine in your body increases and can give you symptoms, such as a rapid heartbeat, sweating, and anxiety.   Avoid smokers because they can make you want to smoke.   Do not let weight gain distract you. Many smokers will gain weight when they quit, usually less than 10 pounds. Eat a healthy diet and stay active. You can always lose the weight gained after you quit.   Find ways to improve your mood other than smoking.  FOR MORE INFORMATION   www.smokefree.gov   Document Released:   04/15/2001 Document Revised: 09/05/2013 Document Reviewed: 07/31/2011  ExitCare Patient Information 2015 [employer]. This information is not intended to replace advice given to you by your health care provider. Make sure you discuss any questions you have with your health care provider.  You Can Quit Smoking  If you are ready to quit smoking or are thinking about it, congratulations! You have chosen to help yourself be healthier and live longer! There are lots of different ways to quit smoking. Nicotine gum, nicotine patches, a nicotine inhaler, or nicotine nasal spray can help with physical craving. Hypnosis, support groups, and medicines help break the habit of smoking.  TIPS TO GET OFF AND STAY OFF CIGARETTES   Learn to predict your moods. Do not let a bad situation be your excuse to have a cigarette. Some situations in your life might tempt you to have a cigarette.   Ask friends and co-workers not to smoke around you.   Make your home smoke-free.   Never have "just one" cigarette. It leads to wanting another and another. Remind yourself of your decision to quit.   On a card, make a list of your reasons for not smoking. Read it at least the same number of  times a day as you have a cigarette. Tell yourself everyday, "I do not want to smoke. I choose not to smoke."   Ask someone at home or work to help you with your plan to quit smoking.   Have something planned after you eat or have a cup of coffee. Take a walk or get other exercise to perk you up. This will help to keep you from overeating.   Try a relaxation exercise to calm you down and decrease your stress. Remember, you may be tense and nervous the first two weeks after you quit. This will pass.   Find new activities to keep your hands busy. Play with a pen, coin, or rubber band. Doodle or draw things on paper.   Brush your teeth right after eating. This will help cut down the craving for the taste of tobacco after meals. You can try mouthwash too.   Try gum, breath mints, or diet candy to keep something in your mouth.  IF YOU SMOKE AND WANT TO QUIT:   Do not stock up on cigarettes. Never buy a carton. Wait until one pack is finished before you buy another.   Never carry cigarettes with you at work or at home.   Keep cigarettes as far away from you as possible. Leave them with someone else.   Never carry matches or a lighter with you.   Ask yourself, "Do I need this cigarette or is this just a reflex?"   Bet with someone that you can quit. Put cigarette money in a piggy bank every morning. If you smoke, you give up the money. If you do not smoke, by the end of the week, you keep the money.   Keep trying. It takes 21 days to change a habit!   Talk to your doctor about using medicines to help you quit. These include nicotine replacement gum, lozenges, or skin patches.  Document Released: 02/15/2009 Document Revised: 07/14/2011 Document Reviewed: 02/15/2009  ExitCare Patient Information 2015 [employer]. This information is not intended to replace advice given to you by your health care provider. Make sure you discuss any questions you have with your health care provider.

## 2014-05-09 LAB — URINALYSIS, COMPLETE
Bacteria, UA: NONE SEEN
Bilirubin Urine: NEGATIVE
Casts: NONE SEEN
Crystals: NONE SEEN
Glucose, UA: NEGATIVE mg/dL
HGB URINE DIPSTICK: NEGATIVE
Ketones, ur: NEGATIVE mg/dL
LEUKOCYTES UA: NEGATIVE
NITRITE: NEGATIVE
PH: 6.5 (ref 5.0–8.0)
PROTEIN: NEGATIVE mg/dL
Specific Gravity, Urine: <1.005 — ABNORMAL LOW (ref 1.005–1.030)
Squamous Epithelial / LPF: NONE SEEN
Urobilinogen, UA: 0.2 mg/dL (ref 0.0–1.0)

## 2014-05-15 ENCOUNTER — Ambulatory Visit: Payer: Self-pay | Attending: Internal Medicine

## 2014-05-17 ENCOUNTER — Other Ambulatory Visit: Payer: Self-pay | Admitting: Internal Medicine

## 2014-05-17 DIAGNOSIS — F17200 Nicotine dependence, unspecified, uncomplicated: Secondary | ICD-10-CM

## 2014-05-17 MED ORDER — VARENICLINE TARTRATE 1 MG PO TABS: 1.0000 mg | ORAL_TABLET | Freq: Two times a day (BID) | ORAL | Status: DC

## 2014-08-28 ENCOUNTER — Ambulatory Visit (HOSPITAL_COMMUNITY)
Admission: RE | Admit: 2014-08-28 | Discharge: 2014-08-28 | Disposition: A | Discharge status: Home / Self Care | Payer: Self-pay | Source: Ambulatory Visit | Attending: Internal Medicine | Admitting: Internal Medicine

## 2014-08-28 ENCOUNTER — Ambulatory Visit (INDEPENDENT_AMBULATORY_CARE_PROVIDER_SITE_OTHER): Payer: Self-pay | Admitting: Internal Medicine

## 2014-08-28 VITALS — BP 145/79 | HR 82 | Temp 98.2°F | Resp 16 | Ht 72.0 in | Wt 160.0 lb

## 2014-08-28 DIAGNOSIS — S61012A Laceration without foreign body of left thumb without damage to nail, initial encounter: Secondary | ICD-10-CM

## 2014-08-28 DIAGNOSIS — X58XXXA Exposure to other specified factors, initial encounter: Secondary | ICD-10-CM | POA: Insufficient documentation

## 2014-08-28 DIAGNOSIS — F17200 Nicotine dependence, unspecified, uncomplicated: Secondary | ICD-10-CM

## 2014-08-28 DIAGNOSIS — Z9109 Other allergy status, other than to drugs and biological substances: Secondary | ICD-10-CM

## 2014-08-28 DIAGNOSIS — R030 Elevated blood-pressure reading, without diagnosis of hypertension: Secondary | ICD-10-CM

## 2014-08-28 DIAGNOSIS — IMO0001 Reserved for inherently not codable concepts without codable children: Secondary | ICD-10-CM

## 2014-08-28 DIAGNOSIS — S61002A Unspecified open wound of left thumb without damage to nail, initial encounter: Secondary | ICD-10-CM

## 2014-08-28 MED ORDER — TRAMADOL HCL 50 MG PO TABS: 50.0000 mg | ORAL_TABLET | Freq: Three times a day (TID) | ORAL | Status: DC | PRN

## 2014-08-28 MED ORDER — TRIAMCINOLONE ACETONIDE 0.5 % EX CREA: 1.0000 "application " | TOPICAL_CREAM | Freq: Three times a day (TID) | CUTANEOUS | Status: DC

## 2014-08-28 MED ORDER — TRIAMCINOLONE ACETONIDE 0.5 % EX CREA
1.0000 | TOPICAL_CREAM | Freq: Three times a day (TID) | CUTANEOUS | Status: AC
Start: 2014-08-28 — End: ?

## 2014-08-28 NOTE — Progress Notes (Signed)
Patient ID: Evan Lyons, male   DOB: June 16, 1965, 49 y.o.   MRN: [id number]   Evan Lyons, is a 49 y.o. male  [id number]  [id number]  DOB - June 16, 1965  CC:  Chief Complaint  Patient presents with  . Rash    itching rash on arms        HPI: Evan Lyons is a 49 y.o. male here today after he sustained a laceration to his left thumb. He states that he has been having pain down the thumb to the base of the thumb. He has been putting neosporin ointment on the thumb. He denies any fevers, chills, N/V/D. He does not think that he had any foreign object left in the thumb, but he has been having a "sticking" pain to the thumb at the area of the laceration. He states that he tried Tylenol and Naprosyn for his pain but they were both ineffective. He describes the pain as throbbing and at an intensity of 8/10. The pain is non-radiating.   Pt also c/o rash onbilateral forearms. The rash is a polymorphous erythematous rash which is pruritic in nature. He reports that the rash emerged when he had exposure of his UE's to the sunlight. He reports that when the weather became cool and his forearms were covered the rash resolved only to re-appear when exposed to the sun.  Pt's BP is mildly elevated but he feels that this is secondary to the pain he is experiencing.  Patient has No headache, No chest pain, No abdominal pain - No Nausea, No new weakness tingling or numbness, No Cough - SOB.  No Known Allergies Past Medical History  Diagnosis Date  . Tooth abscess   . Sternum fx    Current Outpatient Prescriptions on File Prior to Visit  Medication Sig Dispense Refill  . cetirizine (ZYRTEC) 10 MG tablet Take 10 mg by mouth daily as needed for allergies.    . Cimetidine (ACID REDUCER PO) Take 1 tablet by mouth daily.    . Multiple Vitamin (MULTIVITAMIN WITH MINERALS) TABS tablet Take 1 tablet by mouth daily.    . varenicline (CHANTIX CONTINUING MONTH PAK) 1 MG tablet Take 1 tablet (1 mg  total) by mouth 2 (two) times daily. 180 tablet 3  . varenicline (CHANTIX) 0.5 MG tablet Take 1 tablet (0.5 mg total) by mouth 2 (two) times daily. 12 tablet 0   No current facility-administered medications on file prior to visit.   Family History  Problem Relation Age of Onset  . Cancer Father    History   Social History  . Marital Status: Widowed    Spouse Name: N/A  . Number of Children: N/A  . Years of Education: N/A   Occupational History  . Not on file.   Social History Main Topics  . Smoking status: Current Every Day Smoker -- 1.00 packs/day    Types: Cigarettes  . Smokeless tobacco: Former NeurosurgeonUser  . Alcohol Use: 0.0 oz/week     Comment: 1 beer a day  . Drug Use: No  . Sexual Activity: No   Other Topics Concern  . Not on file   Social History Narrative    Review of Systems: Constitutional: Negative for fever, chills, diaphoresis, activity change, appetite change and fatigue. HENT: Negative for ear pain, nosebleeds, congestion, facial swelling, rhinorrhea, neck pain, neck stiffness and ear discharge.  Eyes: Negative for pain, discharge, redness, itching and visual disturbance. Respiratory: Negative for cough, choking, chest tightness, shortness of breath, wheezing and  stridor.  Cardiovascular: Negative for chest pain, palpitations and leg swelling. Gastrointestinal: Negative for abdominal distention.  Musculoskeletal: Negative for back pain, joint swelling, arthralgia and gait problem. Neurological: Negative for dizziness, tremors, seizures, syncope, facial asymmetry, speech difficulty, weakness, light-headedness, numbness and headaches.  Hematological: Negative for adenopathy. Does not bruise/bleed easily. Psychiatric/Behavioral: Negative for hallucinations, behavioral problems, confusion, dysphoric mood, decreased concentration and agitation.     Objective:   Filed Vitals:   08/28/14 1308  BP: 145/79  Pulse: 82  Temp: 98.2 F (36.8 C)  Resp: 16     Physical Exam: Constitutional: Patient appears well-developed and well-nourished. No distress. HENT: Normocephalic, atraumatic, External right and left ear normal. Oropharynx is clear and moist.  Eyes: Conjunctivae and EOM are normal. PERRLA, no scleral icterus. Neck: Normal ROM. Neck supple. No JVD. No tracheal deviation. No thyromegaly. CVS: RRR, S1/S2 +, no murmurs, no gallops, no carotid bruit.  Pulmonary: Effort and breath sounds normal, no stridor, rhonchi, wheezes, rales.  Abdominal: Soft. BS +, no distension, tenderness, rebound or guarding.  Musculoskeletal: Normal range of motion. No edema and no tenderness.  Lymphadenopathy: No lymphadenopathy noted, cervical, inguinal or axillary Neuro: Alert. Normal reflexes, muscle tone coordination. No cranial nerve deficit. Skin: Skin is warm and dry. No rash noted. Not diaphoretic. No erythema. No pallor. Psychiatric: Normal mood and affect. Behavior, judgment, thought content normal. Skin: Pt has a polymorphous, erythematous rash adorning the sun exposed surface of the B/L forearms. .   Lab Results  Component Value Date   WBC 8.3 05/08/2014   HGB 16.0 05/08/2014   HCT 45.7 05/08/2014   MCV 89.8 05/08/2014   PLT 383 05/08/2014   Lab Results  Component Value Date   CREATININE 0.79 05/08/2014   BUN 12 05/08/2014   NA 139 05/08/2014   K 4.4 05/08/2014   CL 100 05/08/2014   CO2 30 05/08/2014    No results found for: HGBA1C Lipid Panel     Component Value Date/Time   CHOL 181 05/08/2014 1211   TRIG 185* 05/08/2014 1211   HDL 58 05/08/2014 1211   CHOLHDL 3.1 05/08/2014 1211   VLDL 37 05/08/2014 1211   LDLCALC 86 05/08/2014 1211       Assessment and plan:   1. Sun allergy - Advised patient to limit exposure of skin to sunlight. - triamcinolone cream (KENALOG) 0.5 %; Apply 1 application topically 3 (three) times daily.  Dispense: 30 g; Refill: 0  2. Laceration of left thumb, initial encounter - Will obtain an x-ray  of hand to evaluate for foreign body.  - DG Hand 2 View Left; Future - traMADol (ULTRAM) 50 MG tablet; Take 1 tablet (50 mg total) by mouth every 8 (eight) hours as needed.  Dispense: 30 tablet; Refill: 0  3. Elevated BP - Will re-check BP at later date. If elevated will start antihypertentive therapy.  4. Tobacco dependence - Pt continues on Chantix and states that he has not smoked in 7 weeks.  Follow-up in 4 weeks of if no improvement  The patient was given clear instructions to go to ER or return to medical center if symptoms don't improve, worsen or new problems develop. The patient verbalized understanding. The patient was told to call to get lab results if they haven't heard anything in the next week.     This note has been created with Education officer, environmental. Any transcriptional errors are unintentional.    MATTHEWS,MICHELLE A., MD [facility]  [facility] [place], [place] [phone]   08/28/2014, 1:43 PM

## 2014-10-16 ENCOUNTER — Emergency Department (HOSPITAL_COMMUNITY)
Admission: EM | Admit: 2014-10-16 | Discharge: 2014-10-16 | Disposition: A | Discharge status: Home / Self Care | Payer: Self-pay | Attending: Emergency Medicine | Admitting: Emergency Medicine

## 2014-10-16 ENCOUNTER — Encounter (HOSPITAL_COMMUNITY): Payer: Self-pay | Admitting: Emergency Medicine

## 2014-10-16 DIAGNOSIS — K029 Dental caries, unspecified: Secondary | ICD-10-CM | POA: Insufficient documentation

## 2014-10-16 DIAGNOSIS — K0889 Other specified disorders of teeth and supporting structures: Secondary | ICD-10-CM

## 2014-10-16 DIAGNOSIS — Z7952 Long term (current) use of systemic steroids: Secondary | ICD-10-CM | POA: Insufficient documentation

## 2014-10-16 DIAGNOSIS — R509 Fever, unspecified: Secondary | ICD-10-CM | POA: Insufficient documentation

## 2014-10-16 DIAGNOSIS — Z720 Tobacco use: Secondary | ICD-10-CM | POA: Insufficient documentation

## 2014-10-16 DIAGNOSIS — K088 Other specified disorders of teeth and supporting structures: Secondary | ICD-10-CM | POA: Insufficient documentation

## 2014-10-16 DIAGNOSIS — Z79899 Other long term (current) drug therapy: Secondary | ICD-10-CM | POA: Insufficient documentation

## 2014-10-16 DIAGNOSIS — Z8781 Personal history of (healed) traumatic fracture: Secondary | ICD-10-CM | POA: Insufficient documentation

## 2014-10-16 DIAGNOSIS — K0381 Cracked tooth: Secondary | ICD-10-CM | POA: Insufficient documentation

## 2014-10-16 MED ORDER — TRAMADOL HCL 50 MG PO TABS: 50.0000 mg | ORAL_TABLET | Freq: Four times a day (QID) | ORAL | Status: DC | PRN

## 2014-10-16 MED ORDER — PENICILLIN V POTASSIUM 250 MG PO TABS: 250.0000 mg | ORAL_TABLET | Freq: Four times a day (QID) | ORAL | Status: AC

## 2014-10-16 NOTE — ED Provider Notes (Signed)
CSN: [id number]     Arrival date & time 10/16/14  1137 History  This chart was scribed for non-physician practitioner, Teressa Lower, NP, working with Jerelyn Scott, MD, by Ronney Lion, ED Scribe. This patient was seen in room TR10C/TR10C and the patient's care was started at 12:11 PM.    Chief Complaint  Patient presents with  . Dental Pain   The history is provided by the patient. No language interpreter was used.    HPI Comments: Evan Lyons is a 49 y.o. male who presents to the Emergency Department complaining of constant, moderate right lower dental radiating to his right sided face that began last night. He denies any known fever but states he feels "warm." Chewing exacerbates the pain. Tylenol provides some, temporary relief. He states he can't remember the last time he saw a dentist. Patient has NKDA. He denies difficulty swallowing or breathing.  Past Medical History  Diagnosis Date  . Tooth abscess   . Sternum fx    Past Surgical History  Procedure Laterality Date  . Hernia repair    . Eye surgery     Family History  Problem Relation Age of Onset  . Cancer Father    History  Substance Use Topics  . Smoking status: Current Every Day Smoker -- 1.00 packs/day    Types: Cigarettes  . Smokeless tobacco: Former Neurosurgeon  . Alcohol Use: 0.0 oz/week     Comment: 1 beer a day    Review of Systems  Constitutional: Positive for fever (subjective).  HENT: Positive for dental problem (right lower dental pain radiating to right sided head). Negative for trouble swallowing.   Respiratory: Negative for shortness of breath.   All other systems reviewed and are negative.     Allergies  Review of patient's allergies indicates no known allergies.  Home Medications   Prior to Admission medications   Medication Sig Start Date End Date Taking? Authorizing Provider  cetirizine (ZYRTEC) 10 MG tablet Take 10 mg by mouth daily as needed for allergies.    Historical Provider, MD   Cimetidine (ACID REDUCER PO) Take 1 tablet by mouth daily.    Historical Provider, MD  Multiple Vitamin (MULTIVITAMIN WITH MINERALS) TABS tablet Take 1 tablet by mouth daily.    Historical Provider, MD  traMADol (ULTRAM) 50 MG tablet Take 1 tablet (50 mg total) by mouth every 8 (eight) hours as needed. 08/28/14   Altha Harm, MD  triamcinolone cream (KENALOG) 0.5 % Apply 1 application topically 3 (three) times daily. 08/28/14   Altha Harm, MD  varenicline (CHANTIX CONTINUING MONTH PAK) 1 MG tablet Take 1 tablet (1 mg total) by mouth 2 (two) times daily. 05/17/14   Quentin Angst, MD  varenicline (CHANTIX) 0.5 MG tablet Take 1 tablet (0.5 mg total) by mouth 2 (two) times daily. 05/08/14   Massie Maroon, FNP   BP 152/81 mmHg  Pulse 67  Temp(Src) 99.7 F (37.6 C) (Oral)  Resp 16  Ht  (1.803 m)  Wt 168 lb (76.204 kg)  BMI 23.44 kg/m2  SpO2 100% Physical Exam  Constitutional: He is oriented to person, place, and time. He appears well-developed and well-nourished. No distress.  HENT:  Head: Normocephalic and atraumatic.  Mouth/Throat:    Cracked tooth. Multiple decayed teeth  Eyes: Conjunctivae and EOM are normal.  Neck: Neck supple. No tracheal deviation present.  Cardiovascular: Normal rate.   Pulmonary/Chest: Effort normal. No respiratory distress.  Musculoskeletal: Normal range of motion.  Neurological: He is alert and oriented to person, place, and time.  Skin: Skin is warm and dry.  Psychiatric: He has a normal mood and affect. His behavior is normal.  Nursing note and vitals reviewed.   ED Course  Procedures (including critical care time)  DIAGNOSTIC STUDIES: Oxygen Saturation is 100% on RA, normal by my interpretation.    COORDINATION OF CARE: 12:12 PM - Discussed treatment plan with pt at bedside which includes Rx antibiotics and dental referral, and pt agreed to plan.  MDM   Final diagnoses:  Toothache   no swelling noted. Pt given pcn  and tramadol and dental referral  I personally performed the services described in this documentation, which was scribed in my presence. The recorded information has been reviewed and is accurate.    Teressa Lower, NP 10/16/14 1228  Jerelyn Scott, MD 10/16/14 1315

## 2014-10-16 NOTE — ED Notes (Signed)
Pt. Stated, my bottom right tooth is hurting started last night.  My face is swollen on the right side.

## 2014-10-16 NOTE — Discharge Instructions (Signed)

## 2015-07-04 ENCOUNTER — Ambulatory Visit (HOSPITAL_COMMUNITY)
Admission: RE | Admit: 2015-07-04 | Discharge: 2015-07-04 | Disposition: A | Discharge status: Home / Self Care | Payer: Self-pay | Source: Ambulatory Visit | Attending: Occupational Medicine | Admitting: Occupational Medicine

## 2015-07-04 ENCOUNTER — Other Ambulatory Visit: Payer: Self-pay | Admitting: Occupational Medicine

## 2015-07-04 DIAGNOSIS — W228XXA Striking against or struck by other objects, initial encounter: Secondary | ICD-10-CM | POA: Insufficient documentation

## 2015-07-04 DIAGNOSIS — S0003XA Contusion of scalp, initial encounter: Secondary | ICD-10-CM | POA: Insufficient documentation

## 2015-07-04 DIAGNOSIS — R52 Pain, unspecified: Secondary | ICD-10-CM

## 2019-07-21 ENCOUNTER — Ambulatory Visit: Payer: Managed Care, Other (non HMO) | Attending: Internal Medicine

## 2019-07-21 DIAGNOSIS — Z23 Encounter for immunization: Secondary | ICD-10-CM

## 2019-07-21 NOTE — Progress Notes (Signed)
   Covid-19 Vaccination Clinic  Name:  Evan Lyons    MRN: [id number] DOB: 1965-07-03  07/21/2019  Evan Lyons was observed post Covid-19 immunization for 15 minutes without incident. He was provided with Vaccine Information Sheet and instruction to access the V-Safe system.   Evan Lyons was instructed to call 911 with any severe reactions post vaccine: Marland Kitchen Difficulty breathing  . Swelling of face and throat  . A fast heartbeat  . A bad rash all over body  . Dizziness and weakness   Immunizations Administered    Name Date Dose VIS Date Route   Pfizer COVID-19 Vaccine 07/21/2019  3:52 PM 0.3 mL 04/15/2019 Intramuscular   Manufacturer: [employer], [employer]   Lot: [id number]   NDC: [address]-3

## 2019-08-16 ENCOUNTER — Ambulatory Visit: Payer: Managed Care, Other (non HMO) | Attending: Internal Medicine

## 2019-08-16 DIAGNOSIS — Z23 Encounter for immunization: Secondary | ICD-10-CM

## 2019-08-16 NOTE — Progress Notes (Signed)
   Covid-19 Vaccination Clinic  Name:  Evan Lyons    MRN: [id number] DOB: 1965-11-26  08/16/2019  Mr. [name] was observed post Covid-19 immunization for 15 minutes without incident. He was provided with Vaccine Information Sheet and instruction to access the V-Safe system.   Mr. [name] was instructed to call 911 with any severe reactions post vaccine: Marland Kitchen Difficulty breathing  . Swelling of face and throat  . A fast heartbeat  . A bad rash all over body  . Dizziness and weakness   Immunizations Administered    Name Date Dose VIS Date Route   Pfizer COVID-19 Vaccine 08/16/2019  3:25 PM 0.3 mL 04/15/2019 Intramuscular   Manufacturer: [employer], [employer]   Lot: [id number]   NDC: [address]-0

## 2020-02-14 ENCOUNTER — Other Ambulatory Visit: Payer: Self-pay

## 2020-02-14 ENCOUNTER — Ambulatory Visit (HOSPITAL_COMMUNITY)
Admission: EM | Admit: 2020-02-14 | Discharge: 2020-02-14 | Disposition: A | Discharge status: Home / Self Care | Payer: Managed Care, Other (non HMO) | Attending: Family Medicine | Admitting: Family Medicine

## 2020-02-14 ENCOUNTER — Encounter (HOSPITAL_COMMUNITY): Payer: Self-pay

## 2020-02-14 DIAGNOSIS — B029 Zoster without complications: Secondary | ICD-10-CM

## 2020-02-14 MED ORDER — GABAPENTIN 300 MG PO CAPS: 300.0000 mg | ORAL_CAPSULE | Freq: Three times a day (TID) | ORAL | 0 refills | Status: AC

## 2020-02-14 MED ORDER — VALACYCLOVIR HCL 1 G PO TABS: 1000.0000 mg | ORAL_TABLET | Freq: Three times a day (TID) | ORAL | 0 refills | Status: AC

## 2020-02-14 MED ORDER — HYDROCODONE-ACETAMINOPHEN 7.5-325 MG PO TABS: 1.0000 | ORAL_TABLET | Freq: Four times a day (QID) | ORAL | 0 refills | Status: AC | PRN

## 2020-02-14 NOTE — ED Provider Notes (Signed)
[facility]    CSN: [id number] Arrival date & time: 02/14/20  1558      History   Chief Complaint Chief Complaint  Patient presents with   Rash    HPI Evan Lyons is a 54 y.o. male.   HPI  Rash on back of head that is very painful Present since yesterday No fever or chills No prior rash in this area Patient did have chickenpox as a child  Past Medical History:  Diagnosis Date   Sternum fx    Tooth abscess     Patient Active Problem List   Diagnosis Date Noted   Annual physical exam 05/08/2014   Tobacco dependence 10/18/2013   Establishing care with new doctor, encounter for 10/18/2013   Numbness of finger 10/18/2013   Heartburn 10/18/2013   Seasonal allergies 10/18/2013    Past Surgical History:  Procedure Laterality Date   EYE SURGERY     HERNIA REPAIR         Home Medications    Prior to Admission medications   Medication Sig Start Date End Date Taking? Authorizing Provider  Multiple Vitamin (MULTIVITAMIN WITH MINERALS) TABS tablet Take 1 tablet by mouth daily.   Yes [provider]  cetirizine (ZYRTEC) 10 MG tablet Take 10 mg by mouth daily as needed for allergies.    [provider]  gabapentin (NEURONTIN) 300 MG capsule Take 1 capsule (300 mg total) by mouth 3 (three) times daily. 02/14/20   Eustace Moore, MD  HYDROcodone-acetaminophen (NORCO) 7.5-325 MG tablet Take 1 tablet by mouth every 6 (six) hours as needed for moderate pain. 02/14/20   Eustace Moore, MD  triamcinolone cream (KENALOG) 0.5 % Apply 1 application topically 3 (three) times daily. 08/28/14   Altha Harm, MD  valACYclovir (VALTREX) 1000 MG tablet Take 1 tablet (1,000 mg total) by mouth 3 (three) times daily. 02/14/20   Eustace Moore, MD  varenicline (CHANTIX) 0.5 MG tablet Take 1 tablet (0.5 mg total) by mouth 2 (two) times daily. 05/08/14   Massie Maroon, FNP    Family History Family History  Problem Relation  Age of Onset   Cancer Father     Social History Social History   Tobacco Use   Smoking status: Current Every Day Smoker    Packs/day: 1.00    Types: Cigarettes   Smokeless tobacco: Former Neurosurgeon  Substance Use Topics   Alcohol use: Yes    Comment: 1 beer a day   Drug use: No     Allergies   Patient has no known allergies.   Review of Systems Review of Systems  See HPI Physical Exam Triage Vital Signs ED Triage Vitals  Enc Vitals Group     BP 02/14/20 1701 139/86     Pulse Rate 02/14/20 1701 81     Resp 02/14/20 1701 15     Temp 02/14/20 1701 98.6 F (37 C)     Temp Source 02/14/20 1701 Oral     SpO2 02/14/20 1701 100 %     Weight --      Height --      Head Circumference --      Peak Flow --      Pain Score 02/14/20 1659 10     Pain Loc --      Pain Edu? --      Excl. in GC? --    No data found.  Updated Vital Signs BP 139/86 (BP Location: Left  Arm)    Pulse 81    Temp 98.6 F (37 C) (Oral)    Resp 15    SpO2 100%      Physical Exam Constitutional:      General: He is not in acute distress.    Appearance: He is well-developed.  HENT:     Head: Normocephalic and atraumatic.   Eyes:     Conjunctiva/sclera: Conjunctivae normal.     Pupils: Pupils are equal, round, and reactive to light.  Cardiovascular:     Rate and Rhythm: Normal rate.  Pulmonary:     Effort: Pulmonary effort is normal. No respiratory distress.  Abdominal:     General: There is no distension.     Palpations: Abdomen is soft.  Musculoskeletal:        General: Normal range of motion.     Cervical back: Normal range of motion.  Skin:    General: Skin is warm and dry.  Neurological:     Mental Status: He is alert.  Psychiatric:        Mood and Affect: Mood normal.        Behavior: Behavior normal.      UC Treatments / Results  Labs (all labs ordered are listed, but only abnormal results are displayed) Labs Reviewed - No data to display  EKG   Radiology No  results found.  Procedures Procedures (including critical care time)  Medications Ordered in UC Medications - No data to display  Initial Impression / Assessment and Plan / UC Course  I have reviewed the triage vital signs and the nursing notes.  Pertinent labs & imaging results that were available during my care of the patient were reviewed by me and considered in my medical decision making (see chart for details).      Final Clinical Impressions(s) / UC Diagnoses   Final diagnoses:  Herpes zoster without complication     Discharge Instructions     Take valacyclovir 3 times a day. This is an antiviral medicine to reduce the infection Gabapentin is a medicine that helps to settle down nerve pain. I would take this once a day at bedtime. You may take it 3 times a day as needed for pain. This might cause drowsiness. 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 If the pain is severe take hydrocodone. Do not take hydrocodone and drive. This can cause drowsiness See your doctor if not improving over the next few days. May return here for complications   ED Prescriptions    Medication Sig Dispense Auth. Provider   valACYclovir (VALTREX) 1000 MG tablet Take 1 tablet (1,000 mg total) by mouth 3 (three) times daily. 21 tablet Eustace Moore, MD   gabapentin (NEURONTIN) 300 MG capsule Take 1 capsule (300 mg total) by mouth 3 (three) times daily. 30 capsule Eustace Moore, MD   HYDROcodone-acetaminophen [facility]) 7.5-325 MG tablet Take 1 tablet by mouth every 6 (six) hours as needed for moderate pain. 15 tablet Eustace Moore, MD     I have reviewed the PDMP during this encounter.   Eustace Moore, MD 02/14/20 2123

## 2020-02-14 NOTE — Discharge Instructions (Signed)
Take valacyclovir 3 times a day. This is an antiviral medicine to reduce the infection Gabapentin is a medicine that helps to settle down nerve pain. I would take this once a day at bedtime. You may take it 3 times a day as needed for pain. This might cause drowsiness. 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 If the pain is severe take hydrocodone. Do not take hydrocodone and drive. This can cause drowsiness See your doctor if not improving over the next few days. May return here for complications

## 2020-02-14 NOTE — ED Triage Notes (Signed)
Pt c/o painful, itchy rash "bump" to posterior left scalp area onset upon waking Sunday.  Several areas of erythema with blister centers noted.  Pt c/o pain toward left ear, neck onset today. Pt reports pain with moving head/neck.  Reports chicken pox during childhood.

## 2020-04-06 ENCOUNTER — Ambulatory Visit: Payer: Managed Care, Other (non HMO) | Attending: Internal Medicine

## 2020-04-06 DIAGNOSIS — Z23 Encounter for immunization: Secondary | ICD-10-CM

## 2020-04-06 NOTE — Progress Notes (Signed)
   Covid-19 Vaccination Clinic  Name:  Evan Lyons    MRN: [id number] DOB: October 28, 1965  04/06/2020  Mr. [name] was observed post Covid-19 immunization for 15 minutes without incident. He was provided with Vaccine Information Sheet and instruction to access the V-Safe system.   Mr. [name] was instructed to call 911 with any severe reactions post vaccine: Marland Kitchen Difficulty breathing  . Swelling of face and throat  . A fast heartbeat  . A bad rash all over body  . Dizziness and weakness   Immunizations Administered    Name Date Dose VIS Date Route   Pfizer COVID-19 Vaccine 04/06/2020  3:21 PM 0.3 mL 02/22/2020 Intramuscular   Manufacturer: [employer], [employer]   Lot: [id number]   NDC: [address]-6

## 2020-08-20 ENCOUNTER — Ambulatory Visit: Payer: Managed Care, Other (non HMO) | Attending: Internal Medicine

## 2020-08-20 ENCOUNTER — Other Ambulatory Visit (HOSPITAL_BASED_OUTPATIENT_CLINIC_OR_DEPARTMENT_OTHER): Payer: Self-pay

## 2020-08-20 DIAGNOSIS — Z23 Encounter for immunization: Secondary | ICD-10-CM

## 2020-08-20 MED ORDER — COVID-19 MRNA VACCINE (PFIZER) 30 MCG/0.3ML IM SUSP
INTRAMUSCULAR | 0 refills | Status: AC
  Filled 2020-08-20: qty 0.3, 1d supply, fill #0

## 2020-08-20 NOTE — Progress Notes (Signed)
   Covid-19 Vaccination Clinic  Name:  Evan Lyons    MRN: [id number] DOB: 12-30-1965  08/20/2020  [name] was observed post Covid-19 immunization for 15 minutes without incident. He was provided with Vaccine Information Sheet and instruction to access the V-Safe system.   [name] was instructed to call 911 with any severe reactions post vaccine: Marland Kitchen Difficulty breathing  . Swelling of face and throat  . A fast heartbeat  . A bad rash all over body  . Dizziness and weakness   Immunizations Administered    Name Date Dose VIS Date Route   PFIZER Comrnaty(Gray TOP) Covid-19 Vaccine 08/20/2020  2:43 PM 0.3 mL 04/12/2020 Intramuscular   Manufacturer: [employer], [employer]   Lot: [id number]   NDC: [phone]

## 2021-05-31 ENCOUNTER — Other Ambulatory Visit: Payer: Self-pay

## 2021-05-31 ENCOUNTER — Ambulatory Visit: Payer: Managed Care, Other (non HMO) | Attending: Internal Medicine

## 2021-05-31 DIAGNOSIS — Z23 Encounter for immunization: Secondary | ICD-10-CM

## 2021-06-03 ENCOUNTER — Other Ambulatory Visit (HOSPITAL_BASED_OUTPATIENT_CLINIC_OR_DEPARTMENT_OTHER): Payer: Self-pay

## 2021-06-03 MED ORDER — PFIZER COVID-19 VAC BIVALENT 30 MCG/0.3ML IM SUSP
INTRAMUSCULAR | 0 refills | Status: AC
  Filled 2021-06-03: qty 0.3, 1d supply, fill #0

## 2021-06-03 NOTE — Progress Notes (Signed)
° °  Covid-19 Vaccination Clinic  Name:  Evan Lyons    MRN: [id number] DOB: 1965-06-04  06/03/2021  [name] was observed post Covid-19 immunization for 15 minutes without incident. He was provided with Vaccine Information Sheet and instruction to access the V-Safe system.   [name] was instructed to call 911 with any severe reactions post vaccine: Difficulty breathing  Swelling of face and throat  A fast heartbeat  A bad rash all over body  Dizziness and weakness   Immunizations Administered     Name Date Dose VIS Date Route   Pfizer Covid-19 Vaccine Bivalent Booster 05/31/2021  3:57 PM 0.3 mL 01/02/2021 Intramuscular   Manufacturer: [employer], [employer]   Lot: [id number]   NDC: [phone]
